# Patient Record
Sex: Female | Born: 1995 | Race: Black or African American | Hispanic: No | Marital: Single | Smoking: Never smoker
Health system: Southern US, Community
[De-identification: ages and names within clinical notes are randomized; demographics above are authoritative.]

---
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
## 2017-01-17 ENCOUNTER — Emergency Department (HOSPITAL_BASED_OUTPATIENT_CLINIC_OR_DEPARTMENT_OTHER)
Admission: EM | Admit: 2017-01-17 | Discharge: 2017-01-17 | Disposition: A | Discharge status: Home / Self Care | Payer: Medicaid Other | Attending: Emergency Medicine | Admitting: Emergency Medicine

## 2017-01-17 ENCOUNTER — Encounter (HOSPITAL_BASED_OUTPATIENT_CLINIC_OR_DEPARTMENT_OTHER): Payer: Self-pay

## 2017-01-17 DIAGNOSIS — J020 Streptococcal pharyngitis: Secondary | ICD-10-CM | POA: Insufficient documentation

## 2017-01-17 LAB — RAPID STREP SCREEN (MED CTR MEBANE ONLY): Streptococcus, Group A Screen (Direct): POSITIVE — AB

## 2017-01-17 MED ORDER — FLUCONAZOLE 200 MG PO TABS: 200.0000 mg | ORAL_TABLET | Freq: Every day | ORAL | 0 refills | Status: AC

## 2017-01-17 MED ORDER — DEXAMETHASONE 4 MG PO TABS
10.0000 mg | ORAL_TABLET | Freq: Once | ORAL | Status: AC
  Administered 2017-01-17: 10 mg via ORAL
  Filled 2017-01-17: qty 1

## 2017-01-17 MED ORDER — PENICILLIN G BENZATHINE 1200000 UNIT/2ML IM SUSP
1.2000 10*6.[IU] | Freq: Once | INTRAMUSCULAR | Status: AC
  Administered 2017-01-17: 1.2 10*6.[IU] via INTRAMUSCULAR
  Filled 2017-01-17: qty 2

## 2017-01-17 NOTE — ED Triage Notes (Signed)
C/o body aches, chills, HA x today-NAD-steady gait

## 2017-01-17 NOTE — ED Provider Notes (Signed)
[facility] Note   CSN: [id number] Arrival date & time: 01/17/17  1918 By signing my name below, I, [name], attest that this documentation has been prepared under the direction and in the presence of Gwyneth SproutPlunkett, Areana Kosanke, MD . Electronically Signed: [name], ED Scribe. 01/17/2017. 8:10 PM.  History   Chief Complaint Chief Complaint  Patient presents with  . Generalized Body Aches   HPI Courtney Ray is a 21 y.o. female who presents to the Emergency Department complaining of constant, moderate sore throat onset this morning. Pain is exacerbated by swallowing. No OTC treatments tried for these symptoms PTA.  She reports associated nasal congestion, generalized body aches, and subjective fever.  Pt denies any other medical problems other than recurrent strep throat. Pt denies cough, and trouble breathing, or abdominal pain.   The history is provided by the patient. No language interpreter was used.    History reviewed. No pertinent past medical history.  There are no active problems to display for this patient.   History reviewed. No pertinent surgical history.  OB History    No data available       Home Medications    Prior to Admission medications   Not on File    Family History No family history on file.  Social History Social History  Substance Use Topics  . Smoking status: Never Smoker  . Smokeless tobacco: Never Used  . Alcohol use Yes     Comment: occ     Allergies   Patient has no known allergies.   Review of Systems Review of Systems All systems reviewed and are negative for acute change except as noted in the HPI.  Physical Exam Updated Vital Signs BP (!) 125/58 (BP Location: Left Arm)   Pulse 100   Temp 99.9 F (37.7 C) (Oral)   Resp 20   Ht 5\' 6"  (1.676 m)   Wt 160 lb (72.6 kg)   LMP 01/03/2017   SpO2 100%   BMI 25.82 kg/m   Physical Exam  Constitutional: She appears well-developed and  well-nourished. No distress.  HENT:  Head: Normocephalic and atraumatic.  Mouth/Throat: Mucous membranes are normal. Oropharyngeal exudate and posterior oropharyngeal erythema present. No posterior oropharyngeal edema or tonsillar abscesses. Tonsils are 2+ on the right. Tonsils are 2+ on the left. Tonsillar exudate.  Bilateral TM within normal limits.  Eyes: Conjunctivae are normal.  Neck: Normal range of motion.  Cardiovascular: Normal rate, regular rhythm and normal heart sounds.  Exam reveals no gallop and no friction rub.   No murmur heard. Pulmonary/Chest: Effort normal and breath sounds normal. No respiratory distress. She has no wheezes. She has no rales. She exhibits no tenderness.  Abdominal: She exhibits no distension.  Musculoskeletal: Normal range of motion.  Lymphadenopathy:    She has cervical adenopathy (bilateral).  Neurological: She is alert.  Skin: No pallor.  Psychiatric: She has a normal mood and affect. Her behavior is normal.  Nursing note and vitals reviewed.  ED Treatments / Results  DIAGNOSTIC STUDIES:  Oxygen Saturation is 100% on RA, normal by my interpretation.    COORDINATION OF CARE:  8:25 PM Discussed treatment plan with pt at bedside and pt agreed to plan.  Labs (all labs ordered are listed, but only abnormal results are displayed) Labs Reviewed  RAPID STREP SCREEN ([facility] AT [facility]) - Abnormal; Notable for the following:       Result Value   Streptococcus, Group A Screen (Direct) POSITIVE (*)  All other components within normal limits    EKG  EKG Interpretation None       Radiology No results found.  Procedures Procedures (including critical care time)  Medications Ordered in ED Medications  dexamethasone (DECADRON) tablet 10 mg (10 mg Oral Given 01/17/17 2032)  penicillin g benzathine (BICILLIN LA) 1200000 UNIT/2ML injection 1.2 Million Units (1.2 Million Units Intramuscular Given 01/17/17 2032)     Initial Impression / Assessment  and Plan / ED Course  I have reviewed the triage vital signs and the nursing notes.  Pertinent labs & imaging results that were available during my care of the patient were reviewed by me and considered in my medical decision making (see chart for details).     Patient with symptoms classic for strep pharyngitis. Exam classic for strep. Strep testing done prior to exam is positive. Patient treated with Decadron and penicillin.  Final Clinical Impressions(s) / ED Diagnoses   Final diagnoses:  Strep pharyngitis    New Prescriptions There are no discharge medications for this patient.   I personally performed the services described in this documentation, which was scribed in my presence.  The recorded information has been reviewed and considered.     Gwyneth Sprout, MD 01/17/17 2100

## 2017-01-17 NOTE — ED Notes (Signed)
ED Provider at bedside. 

## 2017-01-31 ENCOUNTER — Encounter (HOSPITAL_BASED_OUTPATIENT_CLINIC_OR_DEPARTMENT_OTHER): Payer: Self-pay | Admitting: *Deleted

## 2017-01-31 ENCOUNTER — Emergency Department (HOSPITAL_BASED_OUTPATIENT_CLINIC_OR_DEPARTMENT_OTHER)
Admission: EM | Admit: 2017-01-31 | Discharge: 2017-01-31 | Disposition: A | Discharge status: Home / Self Care | Payer: Self-pay | Attending: Emergency Medicine | Admitting: Emergency Medicine

## 2017-01-31 DIAGNOSIS — J069 Acute upper respiratory infection, unspecified: Secondary | ICD-10-CM | POA: Insufficient documentation

## 2017-01-31 DIAGNOSIS — J029 Acute pharyngitis, unspecified: Secondary | ICD-10-CM | POA: Insufficient documentation

## 2017-01-31 LAB — RAPID STREP SCREEN (MED CTR MEBANE ONLY): Streptococcus, Group A Screen (Direct): NEGATIVE

## 2017-01-31 MED ORDER — NAPROXEN 500 MG PO TABS: 500.0000 mg | ORAL_TABLET | Freq: Two times a day (BID) | ORAL | 0 refills | Status: DC

## 2017-01-31 NOTE — Discharge Instructions (Signed)
Take the Naprosyn as directed for the throat pain. Can start over-the-counter cold and cough medicines as needed. Work note provided. Rapid strep testing here today was negative for strep throat.

## 2017-01-31 NOTE — ED Triage Notes (Signed)
Pt presents with sore throat since yesterday (reports being treated for strep a couple weeks ago). Denies fever. Reports nasal congestion.

## 2017-01-31 NOTE — ED Provider Notes (Signed)
[facility]   CSN: [id number] Arrival date & time: 01/31/17  1020     History   Chief Complaint Chief Complaint  Patient presents with  . Sore Throat    HPI Courtney Ray is a 21 y.o. female.  Patient with onset yesterday of sore throat and congestion. Patient about 2 weeks ago was diagnosed with strep throat and treated with IM penicillin. Better. Patient's had stroke frequently. She concerned she got it again. No fevers no nausea no vomiting no rash      History reviewed. No pertinent past medical history.  There are no active problems to display for this patient.   History reviewed. No pertinent surgical history.  OB History    No data available       Home Medications    Prior to Admission medications   Medication Sig Start Date End Date Taking? Authorizing Provider  naproxen (NAPROSYN) 500 MG tablet Take 1 tablet (500 mg total) by mouth 2 (two) times daily. 01/31/17   Vanetta Mulders, MD    Family History No family history on file.  Social History Social History  Substance Use Topics  . Smoking status: Never Smoker  . Smokeless tobacco: Never Used  . Alcohol use Yes     Comment: occ     Allergies   Patient has no known allergies.   Review of Systems Review of Systems  Constitutional: Negative for fever.  HENT: Positive for congestion and sore throat.   Respiratory: Negative for shortness of breath.   Cardiovascular: Negative for chest pain.  Gastrointestinal: Negative for abdominal pain, nausea and vomiting.  Genitourinary: Negative for dysuria.  Musculoskeletal: Negative for back pain.  Skin: Negative for rash.  Neurological: Negative for headaches.  Hematological: Does not bruise/bleed easily.  Psychiatric/Behavioral: Negative for confusion.     Physical Exam Updated Vital Signs BP 119/84 (BP Location: Right Arm)   Pulse 96   Temp 99 F (37.2 C) (Oral)   Resp 18   Ht 1.676 m (5\' 6" )   Wt 72.6 kg  (160 lb)   LMP 01/03/2017   SpO2 100%   BMI 25.82 kg/m   Physical Exam  Constitutional: She is oriented to person, place, and time. She appears well-developed and well-nourished. No distress.  HENT:  Head: Normocephalic and atraumatic.  Mouth/Throat: Oropharynx is clear and moist. No oropharyngeal exudate.  Erythema to the posterior pharynx left greater than right uvula midline no exudate  Eyes: Conjunctivae and EOM are normal. Pupils are equal, round, and reactive to light.  Neck: Normal range of motion. Neck supple.  Cardiovascular: Normal rate, regular rhythm and normal heart sounds.   Pulmonary/Chest: Effort normal and breath sounds normal. No respiratory distress.  Abdominal: Soft. Bowel sounds are normal. There is no tenderness.  Musculoskeletal: Normal range of motion.  Neurological: She is alert and oriented to person, place, and time. No cranial nerve deficit or sensory deficit. She exhibits normal muscle tone.  Skin: Skin is warm.  Nursing note and vitals reviewed.    ED Treatments / Results  Labs (all labs ordered are listed, but only abnormal results are displayed) Labs Reviewed  RAPID STREP SCREEN (NOT AT [facility])  CULTURE, GROUP A STREP [facility])    EKG  EKG Interpretation None       Radiology No results found.  Procedures Procedures (including critical care time)  Medications Ordered in ED Medications - No data to display   Initial Impression / Assessment and Plan / ED  Course  I have reviewed the triage vital signs and the nursing notes.  Pertinent labs & imaging results that were available during my care of the patient were reviewed by me and considered in my medical decision making (see chart for details).    Rapid strep is negative. Most likely viral pharyngitis with upper respiratory infection. Will treat symptomatically.   Final Clinical Impressions(s) / ED Diagnoses   Final diagnoses:  Pharyngitis, unspecified etiology  Viral upper  respiratory tract infection    New Prescriptions New Prescriptions   NAPROXEN (NAPROSYN) 500 MG TABLET    Take 1 tablet (500 mg total) by mouth 2 (two) times daily.     Vanetta MuldersZackowski, Wasil Wolke, MD 01/31/17 1246

## 2017-02-03 LAB — CULTURE, GROUP A STREP (THRC)

## 2018-04-18 ENCOUNTER — Emergency Department (HOSPITAL_BASED_OUTPATIENT_CLINIC_OR_DEPARTMENT_OTHER)
Admission: EM | Admit: 2018-04-18 | Discharge: 2018-04-18 | Disposition: A | Discharge status: Home / Self Care | Payer: Self-pay | Attending: Emergency Medicine | Admitting: Emergency Medicine

## 2018-04-18 ENCOUNTER — Encounter (HOSPITAL_BASED_OUTPATIENT_CLINIC_OR_DEPARTMENT_OTHER): Payer: Self-pay

## 2018-04-18 ENCOUNTER — Other Ambulatory Visit: Payer: Self-pay

## 2018-04-18 DIAGNOSIS — R300 Dysuria: Secondary | ICD-10-CM | POA: Insufficient documentation

## 2018-04-18 LAB — URINALYSIS, ROUTINE W REFLEX MICROSCOPIC
Bilirubin Urine: NEGATIVE
Glucose, UA: NEGATIVE mg/dL
KETONES UR: NEGATIVE mg/dL
NITRITE: NEGATIVE
PROTEIN: NEGATIVE mg/dL
Specific Gravity, Urine: 1.025 (ref 1.005–1.030)
pH: 6.5 (ref 5.0–8.0)

## 2018-04-18 LAB — URINALYSIS, MICROSCOPIC (REFLEX)

## 2018-04-18 LAB — PREGNANCY, URINE: PREG TEST UR: NEGATIVE

## 2018-04-18 MED ORDER — CEPHALEXIN 500 MG PO CAPS: 500.0000 mg | ORAL_CAPSULE | Freq: Two times a day (BID) | ORAL | 0 refills | Status: AC

## 2018-04-18 MED ORDER — PHENAZOPYRIDINE HCL 200 MG PO TABS: 200.0000 mg | ORAL_TABLET | Freq: Three times a day (TID) | ORAL | 0 refills | Status: DC

## 2018-04-18 NOTE — ED Triage Notes (Signed)
C/o dysuria x 2 days-NAD-steady gait 

## 2018-04-18 NOTE — Discharge Instructions (Signed)
You were seen in the emergency department for concern of urinary tract infection.  We discussed your urine results, they are equivocal meaning that you may or may not have a urinary tract infection.  Given that you have all of your classic UTI symptoms, we opted to treat you with antibiotics today.  Take Keflex as scheduled and until completion.  Take Pyridium for associated bladder spasm and pain.  Return to the ER for worsening symptoms, fevers, chills, inability to void urine, pain in your flank area, abnormal vaginal discharge or bleeding.

## 2018-04-18 NOTE — ED Provider Notes (Addendum)
[facility] EMERGENCY DEPARTMENT Provider Note   CSN: [id number] Arrival date & time: 04/18/18  2125     History   Chief Complaint Chief Complaint  Patient presents with  . Dysuria    HPI [name] is a 22 y.o. female with history of UTIs is here for evaluation of "UTI".  She reports associated suprapubic abdominal pain, diffuse low back pain, dysuria, frequent urination.  States the symptoms are very similar to her previous UTIs.  She has had 2 UTIs this year.  LMP 8/23.  She is sexually active with one female partner only with inconsistent condom use.  No other birth control methods.  Denies associated fevers, chills, nausea, vomiting, abnormal vaginal discharge or bleeding, diarrhea or constipation.  No interventions. No alleviating or aggravating symptoms. Severity is mild.  HPI  History reviewed. No pertinent past medical history.  There are no active problems to display for this patient.   History reviewed. No pertinent surgical history.   OB History   None      Home Medications    Prior to Admission medications   Medication Sig Start Date End Date Taking? Authorizing Provider  cephALEXin (KEFLEX) 500 MG capsule Take 1 capsule (500 mg total) by mouth 2 (two) times daily for 7 days. 04/18/18 04/25/18  Liberty HandyGibbons, Trinity Hyland J, PA-C  naproxen (NAPROSYN) 500 MG tablet Take 1 tablet (500 mg total) by mouth 2 (two) times daily. 01/31/17   Vanetta MuldersZackowski, Scott, MD  phenazopyridine (PYRIDIUM) 200 MG tablet Take 1 tablet (200 mg total) by mouth 3 (three) times daily. 04/18/18   Liberty HandyGibbons, Oree Mirelez J, PA-C    Family History No family history on file.  Social History Social History   Tobacco Use  . Smoking status: Never Smoker  . Smokeless tobacco: Never Used  Substance Use Topics  . Alcohol use: Yes    Comment: occ  . Drug use: No     Allergies   Patient has no known allergies.   Review of Systems Review of Systems  Gastrointestinal: Positive for abdominal  pain.  Genitourinary: Positive for dysuria and frequency.  Musculoskeletal: Positive for back pain.  All other systems reviewed and are negative.    Physical Exam Updated Vital Signs BP 133/86 (BP Location: Left Arm)   Pulse 87   Temp 98.6 F (37 C) (Oral)   Resp 16   Ht 5\' 6"  (1.676 m)   Wt 78.9 kg   LMP 04/13/2018   SpO2 100%   BMI 28.08 kg/m   Physical Exam  Constitutional: She is oriented to person, place, and time. She appears well-developed and well-nourished. No distress.  NAD.  HENT:  Head: Normocephalic and atraumatic.  Right Ear: External ear normal.  Left Ear: External ear normal.  Nose: Nose normal.  Eyes: Conjunctivae and EOM are normal.  Neck: Normal range of motion. Neck supple.  Cardiovascular: Normal rate, regular rhythm and normal heart sounds.  No murmur heard. Pulmonary/Chest: Effort normal and breath sounds normal.  Abdominal: Soft. There is no tenderness.  Mild suprapubic tenderness without guarding.  Negative Murphy's and McBurney's.  No CVA tenderness.  Active bowel sounds to lower quadrants.  No G/R/R.  Musculoskeletal: Normal range of motion.  Neurological: She is alert and oriented to person, place, and time.  Skin: Skin is warm and dry. Capillary refill takes less than 2 seconds.  Psychiatric: She has a normal mood and affect. Her behavior is normal. Judgment and thought content normal.  Nursing note and vitals reviewed.  ED Treatments / Results  Labs (all labs ordered are listed, but only abnormal results are displayed) Labs Reviewed  URINALYSIS, ROUTINE W REFLEX MICROSCOPIC - Abnormal; Notable for the following components:      Result Value   APPearance CLOUDY (*)    Hgb urine dipstick TRACE (*)    Leukocytes, UA SMALL (*)    All other components within normal limits  URINALYSIS, MICROSCOPIC (REFLEX) - Abnormal; Notable for the following components:   Bacteria, UA RARE (*)    All other components within normal limits  URINE  CULTURE  PREGNANCY, URINE    EKG None  Radiology No results found.  Procedures Procedures (including critical care time)  Medications Ordered in ED Medications - No data to display   Initial Impression / Assessment and Plan / ED Course  I have reviewed the triage vital signs and the nursing notes.  Pertinent labs & imaging results that were available during my care of the patient were reviewed by me and considered in my medical decision making (see chart for details).  Clinical Course as of Apr 20 7  Thu Apr 18, 2018  2258 Leukocytes, UA(!): SMALL [CG]  2258 Bacteria, UA(!): RARE [CG]  2259 WBC, UA: 21-50 [CG]    Clinical Course User Index [CG] Liberty Handy, PA-C   Likely UTI.  No CVAT to suggest pyelo or renal stone.  22 year old here with concern for UTI.  She has history of frequent UTIs and states her symptoms today are very classic of her UTIs.  She denies constitutional symptoms, abnormal vaginal discharge or bleeding.  Exam is reassuring without CVA tenderness.  She is not concerned about STDs and deferred pelvic exam, I think that this is reasonable as she is in a monogamous relationship without any vaginal symptoms or abnormal bleeding.  Pregnancy is negative.  Urinalysis with small leukocytes, rare bacteria, 21-50 WBCs.  Urine culture sent, given h/o frequent UTI for susceptibility. Given this with symptoms, will opt for keflex and pyridium.  Discussed risk of frequent abx for UTI including abx resistance.  Discussed return precautions. Pt in agreement.     Final Clinical Impressions(s) / ED Diagnoses   Final diagnoses:  Dysuria    ED Discharge Orders         Ordered    cephALEXin (KEFLEX) 500 MG capsule  2 times daily     04/18/18 2306    phenazopyridine (PYRIDIUM) 200 MG tablet  3 times daily     04/18/18 2306           Liberty Handy, PA-C 04/19/18 0014    Tilden Fossa, MD 04/19/18 [phone]

## 2018-04-21 LAB — URINE CULTURE

## 2018-04-22 ENCOUNTER — Telehealth: Payer: Self-pay | Admitting: Emergency Medicine

## 2018-04-22 NOTE — Telephone Encounter (Signed)
Post ED Visit - Positive Culture Follow-up  Culture report reviewed by antimicrobial stewardship pharmacist:  []  Enzo Bi, Pharm.D. []  Celedonio Miyamoto, Pharm.D., BCPS AQ-ID []  Garvin Fila, Pharm.D., BCPS []  Georgina Pillion, Pharm.D., BCPS []  [place], [place].D., BCPS, AAHIVP []  Estella Husk, Pharm.D., BCPS, AAHIVP []  Lysle Pearl, PharmD, BCPS []  Phillips Climes, PharmD, BCPS []  Agapito Games, PharmD, BCPS [x]  Verlan Friends, PharmD  Positive urine culture Treated with cephalexin, organism sensitive to the same and no further patient follow-up is required at this time.  Berle Mull 04/22/2018, 10:16 AM

## 2018-05-01 ENCOUNTER — Other Ambulatory Visit: Payer: Self-pay

## 2018-05-01 ENCOUNTER — Emergency Department (HOSPITAL_BASED_OUTPATIENT_CLINIC_OR_DEPARTMENT_OTHER)
Admission: EM | Admit: 2018-05-01 | Discharge: 2018-05-02 | Disposition: A | Discharge status: Home / Self Care | Payer: Self-pay | Attending: Emergency Medicine | Admitting: Emergency Medicine

## 2018-05-01 ENCOUNTER — Encounter (HOSPITAL_BASED_OUTPATIENT_CLINIC_OR_DEPARTMENT_OTHER): Payer: Self-pay | Admitting: *Deleted

## 2018-05-01 DIAGNOSIS — Z202 Contact with and (suspected) exposure to infections with a predominantly sexual mode of transmission: Secondary | ICD-10-CM

## 2018-05-01 DIAGNOSIS — Z113 Encounter for screening for infections with a predominantly sexual mode of transmission: Secondary | ICD-10-CM | POA: Insufficient documentation

## 2018-05-01 DIAGNOSIS — Z79899 Other long term (current) drug therapy: Secondary | ICD-10-CM | POA: Insufficient documentation

## 2018-05-01 MED ORDER — CEFTRIAXONE SODIUM 250 MG IJ SOLR
250.0000 mg | Freq: Once | INTRAMUSCULAR | Status: AC
  Administered 2018-05-02: 250 mg via INTRAMUSCULAR
  Filled 2018-05-01: qty 250

## 2018-05-01 MED ORDER — AZITHROMYCIN 1 G PO PACK
1.0000 g | PACK | Freq: Once | ORAL | Status: AC
  Administered 2018-05-02: 1 g via ORAL
  Filled 2018-05-01: qty 1

## 2018-05-01 NOTE — ED Triage Notes (Signed)
Pt c/o exposure to STD needs tx for chlamydia

## 2018-05-02 ENCOUNTER — Encounter (HOSPITAL_BASED_OUTPATIENT_CLINIC_OR_DEPARTMENT_OTHER): Payer: Self-pay | Admitting: Emergency Medicine

## 2018-05-02 LAB — URINALYSIS, ROUTINE W REFLEX MICROSCOPIC
BILIRUBIN URINE: NEGATIVE
GLUCOSE, UA: NEGATIVE mg/dL
HGB URINE DIPSTICK: NEGATIVE
KETONES UR: NEGATIVE mg/dL
Leukocytes, UA: NEGATIVE
Nitrite: NEGATIVE
PH: 6 (ref 5.0–8.0)
Protein, ur: NEGATIVE mg/dL
Specific Gravity, Urine: 1.015 (ref 1.005–1.030)

## 2018-05-02 LAB — WET PREP, GENITAL
Clue Cells Wet Prep HPF POC: NONE SEEN
SPERM: NONE SEEN
Trich, Wet Prep: NONE SEEN
Yeast Wet Prep HPF POC: NONE SEEN

## 2018-05-02 LAB — PREGNANCY, URINE: Preg Test, Ur: NEGATIVE

## 2018-05-02 NOTE — ED Provider Notes (Signed)
[facility] EMERGENCY DEPARTMENT Provider Note   CSN: [id number] Arrival date & time: 05/01/18  2339     History   Chief Complaint Chief Complaint  Patient presents with  . Exposure to STD    HPI Courtney Ray is a 22 y.o. female.  The history is provided by the patient.  Exposure to STD  This is a new problem. The current episode started more than 1 week ago. The problem occurs constantly. The problem has not changed since onset.Pertinent negatives include no chest pain, no abdominal pain, no headaches and no shortness of breath. Nothing aggravates the symptoms. Nothing relieves the symptoms. She has tried nothing for the symptoms. The treatment provided no relief.  Informed by ex boyfriend he has chlamydia.    History reviewed. No pertinent past medical history.  There are no active problems to display for this patient.   History reviewed. No pertinent surgical history.   OB History   None      Home Medications    Prior to Admission medications   Medication Sig Start Date End Date Taking? Authorizing Provider  naproxen (NAPROSYN) 500 MG tablet Take 1 tablet (500 mg total) by mouth 2 (two) times daily. 01/31/17   Vanetta Mulders, MD    Family History History reviewed. No pertinent family history.  Social History Social History   Tobacco Use  . Smoking status: Never Smoker  . Smokeless tobacco: Never Used  Substance Use Topics  . Alcohol use: Yes    Comment: occ  . Drug use: No     Allergies   Patient has no known allergies.   Review of Systems Review of Systems  Constitutional: Negative for fever.  Respiratory: Negative for shortness of breath.   Cardiovascular: Negative for chest pain.  Gastrointestinal: Negative for abdominal pain.  Genitourinary: Positive for vaginal discharge.  Musculoskeletal: Negative for back pain and gait problem.  Skin: Negative for rash.  Neurological: Negative for headaches.  All other systems reviewed  and are negative.    Physical Exam Updated Vital Signs BP (!) 146/78   Pulse 75   Temp 98.7 F (37.1 C)   Resp 16   Ht 5\' 6"  (1.676 m)   Wt 78.6 kg   LMP 04/13/2018   SpO2 100%   BMI 27.97 kg/m   Physical Exam  Constitutional: She is oriented to person, place, and time. She appears well-developed and well-nourished. No distress.  HENT:  Head: Normocephalic and atraumatic.  Mouth/Throat: No oropharyngeal exudate.  Eyes: Pupils are equal, round, and reactive to light. Conjunctivae are normal.  Neck: Normal range of motion. Neck supple.  Cardiovascular: Normal rate, regular rhythm, normal heart sounds and intact distal pulses.  Pulmonary/Chest: Effort normal. No stridor. She has no wheezes. She has no rales.  Abdominal: Soft. There is no tenderness.  Genitourinary: Vaginal discharge found.  Genitourinary Comments: White d/c no cmt chaperone present  Musculoskeletal: Normal range of motion.  Neurological: She is alert and oriented to person, place, and time. She displays normal reflexes.  Skin: Skin is warm and dry. Capillary refill takes less than 2 seconds.  Psychiatric: She has a normal mood and affect.     ED Treatments / Results  Labs (all labs ordered are listed, but only abnormal results are displayed) Labs Reviewed  WET PREP, GENITAL  URINALYSIS, ROUTINE W REFLEX MICROSCOPIC  PREGNANCY, URINE  GC/CHLAMYDIA PROBE AMP ([place]) NOT AT [facility]     Procedures Procedures (including critical care time)  Medications  Ordered in ED Medications  cefTRIAXone (ROCEPHIN) injection 250 mg (has no administration in time range)  azithromycin (ZITHROMAX) powder 1 g (has no administration in time range)      Final Clinical Impressions(s) / ED Diagnoses   Treated. No sexual activity of any kind until 7 days after all partners treated.     Return for fevers >100.4 unrelieved by medication, shortness of breath, intractable vomiting, or diarrhea, Inability to  tolerate liquids or food, cough, altered mental status or any concerns. No signs of systemic illness or infection. The patient is nontoxic-appearing on exam and vital signs are within normal limits.   I have reviewed the triage vital signs and the nursing notes. Pertinent labs &imaging results that were available during my care of the patient were reviewed by me and considered in my medical decision making (see chart for details).  After history, exam, and medical workup I feel the patient has been appropriately medically screened and is safe for discharge home. Pertinent diagnoses were discussed with the patient. Patient was given return precautions.     Anjulie Dipierro, MD 05/02/18 0005

## 2018-05-03 LAB — GC/CHLAMYDIA PROBE AMP (~~LOC~~) NOT AT ARMC
Chlamydia: POSITIVE — AB
Neisseria Gonorrhea: POSITIVE — AB

## 2018-12-26 ENCOUNTER — Encounter (HOSPITAL_BASED_OUTPATIENT_CLINIC_OR_DEPARTMENT_OTHER): Payer: Self-pay | Admitting: Emergency Medicine

## 2018-12-26 ENCOUNTER — Emergency Department (HOSPITAL_BASED_OUTPATIENT_CLINIC_OR_DEPARTMENT_OTHER)
Admission: EM | Admit: 2018-12-26 | Discharge: 2018-12-27 | Disposition: A | Discharge status: Home / Self Care | Payer: Self-pay | Attending: Emergency Medicine | Admitting: Emergency Medicine

## 2018-12-26 ENCOUNTER — Other Ambulatory Visit: Payer: Self-pay

## 2018-12-26 DIAGNOSIS — B9689 Other specified bacterial agents as the cause of diseases classified elsewhere: Secondary | ICD-10-CM

## 2018-12-26 DIAGNOSIS — N760 Acute vaginitis: Secondary | ICD-10-CM | POA: Insufficient documentation

## 2018-12-26 LAB — WET PREP, GENITAL
Sperm: NONE SEEN
Trich, Wet Prep: NONE SEEN
Yeast Wet Prep HPF POC: NONE SEEN

## 2018-12-26 LAB — PREGNANCY, URINE: Preg Test, Ur: NEGATIVE

## 2018-12-26 LAB — URINALYSIS, ROUTINE W REFLEX MICROSCOPIC
Bilirubin Urine: NEGATIVE
Glucose, UA: NEGATIVE mg/dL
Hgb urine dipstick: NEGATIVE
Ketones, ur: NEGATIVE mg/dL
Leukocytes,Ua: NEGATIVE
Nitrite: NEGATIVE
Protein, ur: NEGATIVE mg/dL
Specific Gravity, Urine: >1.03 — ABNORMAL HIGH (ref 1.005–1.030)
pH: 6 (ref 5.0–8.0)

## 2018-12-26 MED ORDER — METRONIDAZOLE 500 MG PO TABS: 500.0000 mg | ORAL_TABLET | Freq: Two times a day (BID) | ORAL | 0 refills | Status: DC

## 2018-12-26 NOTE — ED Provider Notes (Signed)
[facility] Provider Note: Lowella Dell, MD, FACEP  CSN: [id number] MRN: [id number] ARRIVAL: 12/26/18 at 2317 ROOM: MH09/MH09   CHIEF COMPLAINT  Vaginal Discharge   HISTORY OF PRESENT ILLNESS  12/26/18 11:41 PM Courtney Ray is a 23 y.o. female with a 3 to 4-day history of vulvovaginal irritation.  She describes the severity as "slight".  She describes it as burning.  She denies dysuria.  She has had a white vaginal discharge with an abnormal odor.  She denies vaginal bleeding.  She denies fever, chills, nausea, vomiting or diarrhea.   History reviewed. No pertinent past medical history.  History reviewed. No pertinent surgical history.  No family history on file.  Social History   Tobacco Use  . Smoking status: Never Smoker  . Smokeless tobacco: Never Used  Substance Use Topics  . Alcohol use: Yes    Comment: occ  . Drug use: No    Prior to Admission medications   Medication Sig Start Date End Date Taking? Authorizing Provider  naproxen (NAPROSYN) 500 MG tablet Take 1 tablet (500 mg total) by mouth 2 (two) times daily. 01/31/17   Vanetta Mulders, MD    Allergies Patient has no known allergies.   REVIEW OF SYSTEMS  Negative except as noted here or in the History of Present Illness.   PHYSICAL EXAMINATION  Initial Vital Signs Blood pressure (!) 135/91, pulse 65, temperature 99.1 F (37.3 C), temperature source Oral, resp. rate 16, height 5\' 6"  (1.676 m), weight 81.6 kg, last menstrual period 12/05/2018, SpO2 100 %.  Examination General: Well-developed, well-nourished female in no acute distress; appearance consistent with age of record HENT: normocephalic; atraumatic Eyes: Normal appearance Neck: supple Heart: regular rate and rhythm Lungs: clear to auscultation bilaterally Abdomen: soft; nondistended; nontender; bowel sounds present GU: Normal external genitalia; physiologic appearing vaginal discharge; no vaginal bleeding; no cervical  motion tenderness; no adnexal tenderness Extremities: No deformity; full range of motion; pulses normal Neurologic: Awake, alert and oriented; motor function intact in all extremities and symmetric; no facial droop Skin: Warm and dry Psychiatric: Normal mood and affect   RESULTS  Summary of this visit's results, reviewed by myself:   EKG Interpretation  Date/Time:    Ventricular Rate:    PR Interval:    QRS Duration:   QT Interval:    QTC Calculation:   R Axis:     Text Interpretation:        Laboratory Studies: Results for orders placed or performed during the hospital encounter of 12/26/18 (from the past 24 hour(s))  Urinalysis, Routine w reflex microscopic     Status: Abnormal   Collection Time: 12/26/18 11:27 PM  Result Value Ref Range   Color, Urine YELLOW YELLOW   APPearance HAZY (A) CLEAR   Specific Gravity, Urine >1.030 (H) 1.005 - 1.030   pH 6.0 5.0 - 8.0   Glucose, UA NEGATIVE NEGATIVE mg/dL   Hgb urine dipstick NEGATIVE NEGATIVE   Bilirubin Urine NEGATIVE NEGATIVE   Ketones, ur NEGATIVE NEGATIVE mg/dL   Protein, ur NEGATIVE NEGATIVE mg/dL   Nitrite NEGATIVE NEGATIVE   Leukocytes,Ua NEGATIVE NEGATIVE  Pregnancy, urine     Status: None   Collection Time: 12/26/18 11:27 PM  Result Value Ref Range   Preg Test, Ur NEGATIVE NEGATIVE  Wet prep, genital     Status: Abnormal   Collection Time: 12/26/18 11:27 PM  Result Value Ref Range   Yeast Wet Prep HPF POC NONE SEEN NONE SEEN   Trich,  Wet Prep NONE SEEN NONE SEEN   Clue Cells Wet Prep HPF POC PRESENT (A) NONE SEEN   WBC, Wet Prep HPF POC FEW (A) NONE SEEN   Sperm NONE SEEN    Imaging Studies: No results found.  ED COURSE and MDM  Nursing notes and initial vitals signs, including pulse oximetry, reviewed.  Vitals:   12/26/18 2324 12/26/18 2334  BP:  (!) 135/91  Pulse:  65  Resp:  16  Temp:  99.1 F (37.3 C)  TempSrc:  Oral  SpO2:  100%  Weight: 81.6 kg   Height: 5\' 6"  (1.676 m)    Diagnostic  studies consistent with bacterial vaginosis.  PROCEDURES    ED DIAGNOSES     ICD-10-CM   1. Bacterial vaginosis N76.0    B96.89        Willowdean Luhmann, MD 12/26/18 2356

## 2018-12-26 NOTE — ED Triage Notes (Signed)
Patient co vaginal discharge and irritation; states onset 2-3 days ago; states odor noticed as well; denies any urinary frequency or dysuria.

## 2018-12-27 NOTE — ED Notes (Signed)
Pt verbalizes understanding of d/c instructions and denies any further needs at this time. 

## 2018-12-30 LAB — GC/CHLAMYDIA PROBE AMP (~~LOC~~) NOT AT ARMC
Chlamydia: NEGATIVE
Neisseria Gonorrhea: NEGATIVE

## 2019-03-19 ENCOUNTER — Other Ambulatory Visit: Payer: Self-pay

## 2019-03-19 ENCOUNTER — Encounter (HOSPITAL_BASED_OUTPATIENT_CLINIC_OR_DEPARTMENT_OTHER): Payer: Self-pay | Admitting: Emergency Medicine

## 2019-03-19 DIAGNOSIS — N760 Acute vaginitis: Secondary | ICD-10-CM | POA: Insufficient documentation

## 2019-03-19 DIAGNOSIS — N390 Urinary tract infection, site not specified: Secondary | ICD-10-CM | POA: Insufficient documentation

## 2019-03-19 LAB — PREGNANCY, URINE: Preg Test, Ur: NEGATIVE

## 2019-03-19 NOTE — ED Triage Notes (Signed)
Pt states she is having irritation with urination that started on Monday  Pt states she is also having some vaginal irritation  Pt has hx of BV  Denies lower abd pain or back pain

## 2019-03-20 ENCOUNTER — Emergency Department (HOSPITAL_BASED_OUTPATIENT_CLINIC_OR_DEPARTMENT_OTHER)
Admission: EM | Admit: 2019-03-20 | Discharge: 2019-03-20 | Disposition: A | Discharge status: Home / Self Care | Payer: Medicaid Other | Attending: Emergency Medicine | Admitting: Emergency Medicine

## 2019-03-20 DIAGNOSIS — N390 Urinary tract infection, site not specified: Secondary | ICD-10-CM

## 2019-03-20 DIAGNOSIS — B9689 Other specified bacterial agents as the cause of diseases classified elsewhere: Secondary | ICD-10-CM

## 2019-03-20 LAB — URINALYSIS, ROUTINE W REFLEX MICROSCOPIC
Bilirubin Urine: NEGATIVE
Glucose, UA: NEGATIVE mg/dL
Hgb urine dipstick: NEGATIVE
Ketones, ur: NEGATIVE mg/dL
Nitrite: NEGATIVE
Protein, ur: NEGATIVE mg/dL
Specific Gravity, Urine: >1.03 — ABNORMAL HIGH (ref 1.005–1.030)
pH: 6 (ref 5.0–8.0)

## 2019-03-20 LAB — URINALYSIS, MICROSCOPIC (REFLEX)

## 2019-03-20 LAB — WET PREP, GENITAL
Sperm: NONE SEEN
Trich, Wet Prep: NONE SEEN
Yeast Wet Prep HPF POC: NONE SEEN

## 2019-03-20 MED ORDER — METRONIDAZOLE 500 MG PO TABS: 500.0000 mg | ORAL_TABLET | Freq: Two times a day (BID) | ORAL | 0 refills | Status: DC

## 2019-03-20 MED ORDER — FOSFOMYCIN TROMETHAMINE 3 G PO PACK
3.0000 g | PACK | Freq: Once | ORAL | Status: AC
  Administered 2019-03-20: 3 g via ORAL
  Filled 2019-03-20: qty 3

## 2019-03-20 NOTE — ED Provider Notes (Signed)
[facility] Provider Note: Lowella DellJ. Lane Virdia Ziesmer, MD, FACEP  CSN: [id number] MRN: [id number] ARRIVAL: 03/19/19 at 2330 ROOM: MH05/MH05   CHIEF COMPLAINT  Dysuria   HISTORY OF PRESENT ILLNESS  03/20/19 1:31 AM Courtney Ray is a 23 y.o. female with 3 days of discomfort with urination.  The discomfort is felt in her vulvovaginal region.  The symptoms are mild but remind her of past bacterial vaginosis.  She has a watery discharge earlier but none presently.  She has not used any over-the-counter products to treat her symptoms.  She denies fever, chills, nausea, vomiting, diarrhea or back pain.   History reviewed. No pertinent past medical history.  History reviewed. No pertinent surgical history.  Family History  Problem Relation Age of Onset  . Diabetes Brother   . Stroke Other   . Hypertension Other     Social History   Tobacco Use  . Smoking status: Never Smoker  . Smokeless tobacco: Never Used  Substance Use Topics  . Alcohol use: Yes    Comment: occ  . Drug use: No    Prior to Admission medications   Medication Sig Start Date End Date Taking? Authorizing Provider  metroNIDAZOLE (FLAGYL) 500 MG tablet Take 1 tablet (500 mg total) by mouth 2 (two) times daily. One po bid x 7 days 12/26/18   Chip Canepa, Jonny RuizJohn, MD    Allergies Patient has no known allergies.   REVIEW OF SYSTEMS  Negative except as noted here or in the History of Present Illness.   PHYSICAL EXAMINATION  Initial Vital Signs Blood pressure (!) 130/97, pulse 90, temperature 98.7 F (37.1 C), temperature source Oral, resp. rate 16, height 5\' 6"  (1.676 m), weight 83 kg, last menstrual period 03/10/2019, SpO2 100 %.  Examination General: Well-developed, well-nourished female in no acute distress; appearance consistent with age of record HENT: normocephalic; atraumatic Eyes: pupils equal, round and reactive to light; extraocular muscles intact Neck: supple Heart: regular rate and rhythm Lungs:  clear to auscultation bilaterally Abdomen: soft; nondistended; suprapubic tenderness; bowel sounds present GU: No CVA tenderness; normal external genitalia; physiologic appearing vaginal discharge; no vaginal bleeding; no cervical motion tenderness; no adnexal tenderness; bladder tenderness Extremities: No deformity; full range of motion; pulses normal Neurologic: Awake, alert and oriented; motor function intact in all extremities and symmetric; no facial droop Skin: Warm and dry Psychiatric: Normal mood and affect   RESULTS  Summary of this visit's results, reviewed by myself:   EKG Interpretation  Date/Time:    Ventricular Rate:    PR Interval:    QRS Duration:   QT Interval:    QTC Calculation:   R Axis:     Text Interpretation:        Laboratory Studies: Results for orders placed or performed during the hospital encounter of 03/20/19 (from the past 24 hour(s))  Urinalysis, Routine w reflex microscopic- may I&O cath if menses     Status: Abnormal   Collection Time: 03/19/19 11:48 PM  Result Value Ref Range   Color, Urine YELLOW YELLOW   APPearance CLOUDY (A) CLEAR   Specific Gravity, Urine >1.030 (H) 1.005 - 1.030   pH 6.0 5.0 - 8.0   Glucose, UA NEGATIVE NEGATIVE mg/dL   Hgb urine dipstick NEGATIVE NEGATIVE   Bilirubin Urine NEGATIVE NEGATIVE   Ketones, ur NEGATIVE NEGATIVE mg/dL   Protein, ur NEGATIVE NEGATIVE mg/dL   Nitrite NEGATIVE NEGATIVE   Leukocytes,Ua TRACE (A) NEGATIVE  Pregnancy, urine     Status: None  Collection Time: 03/19/19 11:48 PM  Result Value Ref Range   Preg Test, Ur NEGATIVE NEGATIVE  Urinalysis, Microscopic (reflex)     Status: Abnormal   Collection Time: 03/19/19 11:48 PM  Result Value Ref Range   RBC / HPF 0-5 0 - 5 RBC/hpf   WBC, UA 21-50 0 - 5 WBC/hpf   Bacteria, UA MANY (A) NONE SEEN   Squamous Epithelial / LPF 0-5 0 - 5  Wet prep, genital     Status: Abnormal   Collection Time: 03/20/19  1:47 AM   Specimen: Cervical/Vaginal swab   Result Value Ref Range   Yeast Wet Prep HPF POC NONE SEEN NONE SEEN   Trich, Wet Prep NONE SEEN NONE SEEN   Clue Cells Wet Prep HPF POC PRESENT (A) NONE SEEN   WBC, Wet Prep HPF POC MANY (A) NONE SEEN   Sperm NONE SEEN    Imaging Studies: No results found.  ED COURSE and MDM  Nursing notes and initial vitals signs, including pulse oximetry, reviewed.  Vitals:   03/19/19 2339 03/19/19 2340  BP: (!) 130/97   Pulse: 90   Resp: 16   Temp: 98.7 F (37.1 C)   TempSrc: Oral   SpO2: 100%   Weight:  83 kg  Height:  5\' 6"  (1.676 m)   History and laboratory studies consistent with urinary tract infection and bacterial vaginosis.  PROCEDURES    ED DIAGNOSES     ICD-10-CM   1. Lower urinary tract infectious disease  N39.0   2. Bacterial vaginosis  N76.0    B96.89        Mikolaj Woolstenhulme, Jenny Reichmann, MD 03/20/19 [phone]

## 2019-03-21 LAB — GC/CHLAMYDIA PROBE AMP (~~LOC~~) NOT AT ARMC
Chlamydia: NEGATIVE
Neisseria Gonorrhea: NEGATIVE

## 2019-03-22 LAB — URINE CULTURE: Culture: 20000 — AB

## 2019-03-23 ENCOUNTER — Telehealth: Payer: Self-pay | Admitting: Emergency Medicine

## 2019-03-23 NOTE — Telephone Encounter (Signed)
Post ED Visit - Positive Culture Follow-up  Culture report reviewed by antimicrobial stewardship pharmacist: [place] Team []  Elenor Quinones, Pharm.D. []  Heide Guile, Pharm.D., BCPS AQ-ID []  Parks Neptune, Pharm.D., BCPS []  Alycia Rossetti, Pharm.D., BCPS []  [place], [place].D., BCPS, AAHIVP []  Legrand Como, Pharm.D., BCPS, AAHIVP []  Salome Arnt, PharmD, BCPS []  Johnnette Gourd, PharmD, BCPS [x]  Hughes Better, PharmD, BCPS []  Leeroy Cha, PharmD []  Laqueta Linden, PharmD, BCPS []  Albertina Parr, PharmD  [place] Team []  Leodis Sias, PharmD []  Lindell Spar, PharmD []  Royetta Asal, PharmD []  Graylin Shiver, Rph []  Rema Fendt) Glennon Mac, PharmD []  Arlyn Dunning, PharmD []  Netta Cedars, PharmD []  Dia Sitter, PharmD []  Leone Haven, PharmD []  Gretta Arab, PharmD []  Theodis Shove, PharmD []  Peggyann Juba, PharmD []  Reuel Boom, PharmD   Positive urine culture Treated with Metronidazole, organism sensitive to the same and no further patient follow-up is required at this time.  Sandi Raveling Larae Caison 03/23/2019, 1:48 PM

## 2019-08-30 ENCOUNTER — Emergency Department (HOSPITAL_BASED_OUTPATIENT_CLINIC_OR_DEPARTMENT_OTHER)
Admission: EM | Admit: 2019-08-30 | Discharge: 2019-08-30 | Disposition: A | Discharge status: Home / Self Care | Payer: Medicaid Other | Attending: Emergency Medicine | Admitting: Emergency Medicine

## 2019-08-30 ENCOUNTER — Encounter (HOSPITAL_BASED_OUTPATIENT_CLINIC_OR_DEPARTMENT_OTHER): Payer: Self-pay | Admitting: *Deleted

## 2019-08-30 ENCOUNTER — Other Ambulatory Visit: Payer: Self-pay

## 2019-08-30 DIAGNOSIS — N3090 Cystitis, unspecified without hematuria: Secondary | ICD-10-CM

## 2019-08-30 LAB — URINALYSIS, ROUTINE W REFLEX MICROSCOPIC
Bilirubin Urine: NEGATIVE
Glucose, UA: NEGATIVE mg/dL
Hgb urine dipstick: NEGATIVE
Ketones, ur: NEGATIVE mg/dL
Nitrite: NEGATIVE
Protein, ur: NEGATIVE mg/dL
Specific Gravity, Urine: 1.02 (ref 1.005–1.030)
pH: 7 (ref 5.0–8.0)

## 2019-08-30 LAB — WET PREP, GENITAL
Clue Cells Wet Prep HPF POC: NONE SEEN
Sperm: NONE SEEN
Trich, Wet Prep: NONE SEEN
Yeast Wet Prep HPF POC: NONE SEEN

## 2019-08-30 LAB — URINALYSIS, MICROSCOPIC (REFLEX): Squamous Epithelial / HPF: >50 (ref 0–5)

## 2019-08-30 LAB — PREGNANCY, URINE: Preg Test, Ur: NEGATIVE

## 2019-08-30 MED ORDER — SULFAMETHOXAZOLE-TRIMETHOPRIM 800-160 MG PO TABS
1.0000 | ORAL_TABLET | Freq: Once | ORAL | Status: AC
  Administered 2019-08-30: 1 via ORAL
  Filled 2019-08-30: qty 1

## 2019-08-30 MED ORDER — SULFAMETHOXAZOLE-TRIMETHOPRIM 800-160 MG PO TABS: 1.0000 | ORAL_TABLET | Freq: Two times a day (BID) | ORAL | 0 refills | Status: AC

## 2019-08-30 NOTE — ED Triage Notes (Signed)
Pt reports dysuria x 1 week. She has been taking AZO. Also c/o lower abdominal pain and vaginal discharge

## 2019-08-30 NOTE — ED Provider Notes (Signed)
[place] EMERGENCY DEPARTMENT Provider Note   CSN: [id number] Arrival date & time: 08/30/19  1914     History Chief Complaint  Patient presents with  . Dysuria    Courtney Ray is a 24 y.o. female.  Pt presents to the ED today with dysuria for 1 week.  The pt has been taking AZOs which has not helping.  The pt denies sob or cp.  No f/c.  Pt has not been sexually active in 4 months.        History reviewed. No pertinent past medical history.  There are no problems to display for this patient.   History reviewed. No pertinent surgical history.   OB History   No obstetric history on file.     Family History  Problem Relation Age of Onset  . Diabetes Brother   . Stroke Other   . Hypertension Other     Social History   Tobacco Use  . Smoking status: Never Smoker  . Smokeless tobacco: Never Used  Substance Use Topics  . Alcohol use: Yes    Comment: occ  . Drug use: No    Home Medications Prior to Admission medications   Medication Sig Start Date End Date Taking? Authorizing Provider  metroNIDAZOLE (FLAGYL) 500 MG tablet Take 1 tablet (500 mg total) by mouth 2 (two) times daily. One po bid x 7 days 03/20/19   Molpus, John, MD  sulfamethoxazole-trimethoprim (BACTRIM DS) 800-160 MG tablet Take 1 tablet by mouth 2 (two) times daily for 7 days. 08/30/19 09/06/19  Isla Pence, MD    Allergies    Patient has no known allergies.  Review of Systems   Review of Systems  Genitourinary: Positive for dysuria.  All other systems reviewed and are negative.   Physical Exam Updated Vital Signs BP 125/73 (BP Location: Right Arm)   Pulse 74   Temp 98.4 F (36.9 C) (Oral)   Resp 18   Ht 5\' 6"  (1.676 m)   Wt 79.2 kg   LMP 08/09/2019 (Approximate)   SpO2 100%   BMI 28.20 kg/m   Physical Exam Vitals and nursing note reviewed. Exam conducted with a chaperone present.  Constitutional:      Appearance: Normal appearance.  HENT:     Head:  Normocephalic and atraumatic.     Right Ear: External ear normal.     Left Ear: External ear normal.     Nose: Nose normal.     Mouth/Throat:     Mouth: Mucous membranes are moist.     Pharynx: Oropharynx is clear.  Eyes:     Extraocular Movements: Extraocular movements intact.     Conjunctiva/sclera: Conjunctivae normal.     Pupils: Pupils are equal, round, and reactive to light.  Cardiovascular:     Rate and Rhythm: Normal rate and regular rhythm.     Pulses: Normal pulses.     Heart sounds: Normal heart sounds.  Pulmonary:     Effort: Pulmonary effort is normal.     Breath sounds: Normal breath sounds.  Abdominal:     General: Abdomen is flat. Bowel sounds are normal.     Palpations: Abdomen is soft.     Tenderness: There is abdominal tenderness in the suprapubic area.  Genitourinary:    Exam position: Lithotomy position.     Vagina: Normal.     Cervix: Discharge present.     Adnexa: Right adnexa normal and left adnexa normal.  Musculoskeletal:  General: Normal range of motion.     Cervical back: Normal range of motion and neck supple.  Skin:    General: Skin is warm.     Capillary Refill: Capillary refill takes less than 2 seconds.  Neurological:     General: No focal deficit present.     Mental Status: She is alert and oriented to person, place, and time.  Psychiatric:        Mood and Affect: Mood normal.        Behavior: Behavior normal.        Thought Content: Thought content normal.        Judgment: Judgment normal.     ED Results / Procedures / Treatments   Labs (all labs ordered are listed, but only abnormal results are displayed) Labs Reviewed  WET PREP, GENITAL - Abnormal; Notable for the following components:      Result Value   WBC, Wet Prep HPF POC FEW (*)    All other components within normal limits  URINALYSIS, ROUTINE W REFLEX MICROSCOPIC - Abnormal; Notable for the following components:   APPearance HAZY (*)    Leukocytes,Ua SMALL (*)     All other components within normal limits  URINALYSIS, MICROSCOPIC (REFLEX) - Abnormal; Notable for the following components:   Bacteria, UA MANY (*)    All other components within normal limits  PREGNANCY, URINE  GC/CHLAMYDIA PROBE AMP ([place]) NOT AT [facility]    EKG None  Radiology No results found.  Procedures Procedures (including critical care time)  Medications Ordered in ED Medications  sulfamethoxazole-trimethoprim (BACTRIM DS) 800-160 MG per tablet 1 tablet (has no administration in time range)    ED Course  I have reviewed the triage vital signs and the nursing notes.  Pertinent labs & imaging results that were available during my care of the patient were reviewed by me and considered in my medical decision making (see chart for details).    MDM Rules/Calculators/A&P                      Pt does have many bacteria in her urine.  She is symptomatic, so will be treated with bactrim.  Return if worse.  F/u with pcp.  Final Clinical Impression(s) / ED Diagnoses Final diagnoses:  Cystitis    Rx / DC Orders ED Discharge Orders         Ordered    sulfamethoxazole-trimethoprim (BACTRIM DS) 800-160 MG tablet  2 times daily     08/30/19 2052           Jacalyn Lefevre, MD 08/30/19 2052

## 2019-09-02 LAB — GC/CHLAMYDIA PROBE AMP (~~LOC~~) NOT AT ARMC
Chlamydia: NEGATIVE
Neisseria Gonorrhea: NEGATIVE

## 2019-12-09 ENCOUNTER — Encounter (HOSPITAL_BASED_OUTPATIENT_CLINIC_OR_DEPARTMENT_OTHER): Payer: Self-pay

## 2019-12-09 ENCOUNTER — Other Ambulatory Visit: Payer: Self-pay

## 2019-12-09 ENCOUNTER — Emergency Department (HOSPITAL_BASED_OUTPATIENT_CLINIC_OR_DEPARTMENT_OTHER)
Admission: EM | Admit: 2019-12-09 | Discharge: 2019-12-09 | Disposition: A | Discharge status: Home / Self Care | Payer: Managed Care, Other (non HMO) | Attending: Emergency Medicine | Admitting: Emergency Medicine

## 2019-12-09 DIAGNOSIS — N899 Noninflammatory disorder of vagina, unspecified: Secondary | ICD-10-CM | POA: Diagnosis not present

## 2019-12-09 DIAGNOSIS — N390 Urinary tract infection, site not specified: Secondary | ICD-10-CM | POA: Diagnosis not present

## 2019-12-09 DIAGNOSIS — N898 Other specified noninflammatory disorders of vagina: Secondary | ICD-10-CM

## 2019-12-09 DIAGNOSIS — R300 Dysuria: Secondary | ICD-10-CM | POA: Diagnosis present

## 2019-12-09 LAB — URINALYSIS, ROUTINE W REFLEX MICROSCOPIC
Bilirubin Urine: NEGATIVE
Glucose, UA: NEGATIVE mg/dL
Ketones, ur: NEGATIVE mg/dL
Nitrite: NEGATIVE
Protein, ur: NEGATIVE mg/dL
Specific Gravity, Urine: >1.03 — ABNORMAL HIGH (ref 1.005–1.030)
pH: 6 (ref 5.0–8.0)

## 2019-12-09 LAB — HIV ANTIBODY (ROUTINE TESTING W REFLEX): HIV Screen 4th Generation wRfx: NONREACTIVE

## 2019-12-09 LAB — WET PREP, GENITAL
Clue Cells Wet Prep HPF POC: NONE SEEN
Sperm: NONE SEEN
Trich, Wet Prep: NONE SEEN
Yeast Wet Prep HPF POC: NONE SEEN

## 2019-12-09 LAB — URINALYSIS, MICROSCOPIC (REFLEX): RBC / HPF: NONE SEEN RBC/hpf (ref 0–5)

## 2019-12-09 LAB — PREGNANCY, URINE: Preg Test, Ur: NEGATIVE

## 2019-12-09 MED ORDER — PHENAZOPYRIDINE HCL 200 MG PO TABS: 200.0000 mg | ORAL_TABLET | Freq: Three times a day (TID) | ORAL | 0 refills | Status: DC

## 2019-12-09 MED ORDER — CEPHALEXIN 500 MG PO CAPS: 500.0000 mg | ORAL_CAPSULE | Freq: Three times a day (TID) | ORAL | 0 refills | Status: DC

## 2019-12-09 MED FILL — PHENAZOPYRIDINE 200 MG TAB: 200 | 2 days supply | Qty: 6 | Fill #0

## 2019-12-09 MED FILL — CEPHALEXIN 500 MG CAPSULE: 500 | 7 days supply | Qty: 21 | Fill #0

## 2019-12-09 NOTE — ED Triage Notes (Signed)
Pt states that she thinks she has a UTI, c/o some discharge, pain and discomfort with urination. Pt reports she drinks a lot of energy drinks.

## 2019-12-09 NOTE — Discharge Instructions (Addendum)
You seen in the emergency department for evaluation of burning with urination and vaginal discharge.  You had STD testing that was pending at time of discharge.  We will call you if anything ends up being positive.  We are treating you with a urinary tract infection with antibiotics and some medicine to help with the stinging.  Please keep well-hydrated.  Follow-up with your doctor or return to the emergency department if any worsening or concerning symptoms

## 2019-12-09 NOTE — ED Provider Notes (Signed)
[facility] EMERGENCY DEPARTMENT Provider Note   CSN: [id number] Arrival date & time: 12/09/19  1152     History Chief Complaint  Patient presents with  . Dysuria    Courtney Ray is a 24 y.o. female.  She is here with a concern for urinary tract infection or an STD.  She said she has had some urinary frequency and some dysuria for the last few days.  She is also noticed some itching in her vaginal area.  She is sexually active and usually uses barrier protection but sometimes does not.  No prior history of STDs.  No fevers chills nausea vomiting abdominal pain.  The history is provided by the patient.  Dysuria Pain quality:  Burning Pain severity:  Mild Onset quality:  Gradual Timing:  Intermittent Progression:  Unchanged Chronicity:  New Recent urinary tract infections: no   Relieved by:  Nothing Worsened by:  Nothing Ineffective treatments:  None tried Urinary symptoms: frequent urination   Urinary symptoms: no hematuria and no bladder incontinence   Associated symptoms: vaginal discharge   Associated symptoms: no abdominal pain, no fever, no nausea and no vomiting   Vaginal discharge:    Quality:  White   Severity:  Mild   Onset quality:  Gradual   Timing:  Intermittent   Progression:  Unchanged   Chronicity:  New Risk factors: sexually active   Risk factors: no sexually transmitted infections        History reviewed. No pertinent past medical history.  There are no problems to display for this patient.   History reviewed. No pertinent surgical history.   OB History   No obstetric history on file.     Family History  Problem Relation Age of Onset  . Diabetes Brother   . Stroke Other   . Hypertension Other     Social History   Tobacco Use  . Smoking status: Never Smoker  . Smokeless tobacco: Never Used  Substance Use Topics  . Alcohol use: Yes    Comment: occ  . Drug use: No    Home Medications Prior to Admission medications     Medication Sig Start Date End Date Taking? Authorizing Provider  metroNIDAZOLE (FLAGYL) 500 MG tablet Take 1 tablet (500 mg total) by mouth 2 (two) times daily. One po bid x 7 days 03/20/19   Molpus, John, MD    Allergies    Patient has no known allergies.  Review of Systems   Review of Systems  Constitutional: Negative for fever.  HENT: Negative for sore throat.   Eyes: Negative for visual disturbance.  Respiratory: Negative for cough.   Cardiovascular: Negative for chest pain.  Gastrointestinal: Negative for abdominal pain, nausea and vomiting.  Genitourinary: Positive for dysuria and vaginal discharge.  Musculoskeletal: Negative for back pain.  Skin: Negative for rash.  Neurological: Negative for headaches.    Physical Exam Updated Vital Signs BP 121/73 (BP Location: Right Arm)   Pulse 85   Temp 98.2 F (36.8 C) (Oral)   Resp 14   Ht 5\' 6"  (1.676 m)   Wt 77.1 kg   LMP 11/09/2019   SpO2 99%   BMI 27.44 kg/m   Physical Exam Vitals and nursing note reviewed.  Constitutional:      General: She is not in acute distress.    Appearance: She is well-developed.  HENT:     Head: Normocephalic and atraumatic.  Eyes:     Conjunctiva/sclera: Conjunctivae normal.  Cardiovascular:  Rate and Rhythm: Normal rate and regular rhythm.     Heart sounds: No murmur.  Pulmonary:     Effort: Pulmonary effort is normal. No respiratory distress.     Breath sounds: Normal breath sounds.  Abdominal:     Palpations: Abdomen is soft.     Tenderness: There is no abdominal tenderness.  Genitourinary:    Comments: Pelvic exam done with nurse Sophie as chaperone.  She has no external lesions.  Speculum exam showing thick white and yellow discharge mild to moderate amount.  Sample sent for GC chlamydia and wet prep.  No cervical motion tenderness no adnexal masses or tenderness. Musculoskeletal:        General: No deformity or signs of injury. Normal range of motion.     Cervical back:  Neck supple.  Skin:    General: Skin is warm and dry.     Capillary Refill: Capillary refill takes less than 2 seconds.  Neurological:     General: No focal deficit present.     Mental Status: She is alert.     ED Results / Procedures / Treatments   Labs (all labs ordered are listed, but only abnormal results are displayed) Labs Reviewed  WET PREP, GENITAL - Abnormal; Notable for the following components:      Result Value   WBC, Wet Prep HPF POC MANY (*)    All other components within normal limits  URINALYSIS, ROUTINE W REFLEX MICROSCOPIC - Abnormal; Notable for the following components:   APPearance HAZY (*)    Specific Gravity, Urine >1.030 (*)    Hgb urine dipstick SMALL (*)    Leukocytes,Ua MODERATE (*)    All other components within normal limits  URINALYSIS, MICROSCOPIC (REFLEX) - Abnormal; Notable for the following components:   Bacteria, UA MANY (*)    All other components within normal limits  PREGNANCY, URINE  RPR  HIV ANTIBODY (ROUTINE TESTING W REFLEX)  GC/CHLAMYDIA PROBE AMP ([place]) NOT AT [facility]    EKG None  Radiology No results found.  Procedures Procedures (including critical care time)  Medications Ordered in ED Medications - No data to display  ED Course  I have reviewed the triage vital signs and the nursing notes.  Pertinent labs & imaging results that were available during my care of the patient were reviewed by me and considered in my medical decision making (see chart for details).    MDM Rules/Calculators/A&P                     24 year old female here with couple of days of dysuria and vaginal discharge.  Differential includes UTI, STD, BV, retained foreign body.  Patient requests STD testing.  Her urinalysis shows 21-50 whites and many bacteria nitrite negative equivocal sample with 10-20 squames.  Wet prep showing no yeast no tract no clue cells.  GC chlamydia, RPR, HIV testing pending at time of discharge.  Will treat for possible  UTI with antibiotics and Pyridium.  Patient understands we will call her if any other results are positive.  Counseled on barrier protection.  Return instructions discussed.  Final Clinical Impression(s) / ED Diagnoses Final diagnoses:  Lower urinary tract infectious disease  Vaginal discharge    Rx / DC Orders ED Discharge Orders         Ordered    cephALEXin (KEFLEX) 500 MG capsule  3 times daily     12/09/19 1354    phenazopyridine (PYRIDIUM) 200 MG tablet  3 times daily     12/09/19 1354           Hayden Rasmussen, MD 12/09/19 [phone]

## 2019-12-10 LAB — GC/CHLAMYDIA PROBE AMP (~~LOC~~) NOT AT ARMC
Chlamydia: NEGATIVE
Comment: NEGATIVE
Comment: NORMAL
Neisseria Gonorrhea: NEGATIVE

## 2019-12-10 LAB — RPR: RPR Ser Ql: NONREACTIVE

## 2021-02-09 ENCOUNTER — Telehealth: Payer: Self-pay | Admitting: Nurse Practitioner

## 2021-02-09 ENCOUNTER — Other Ambulatory Visit: Payer: Self-pay

## 2021-02-09 ENCOUNTER — Encounter (HOSPITAL_BASED_OUTPATIENT_CLINIC_OR_DEPARTMENT_OTHER): Payer: Self-pay | Admitting: Emergency Medicine

## 2021-02-09 ENCOUNTER — Emergency Department (HOSPITAL_BASED_OUTPATIENT_CLINIC_OR_DEPARTMENT_OTHER)
Admission: EM | Admit: 2021-02-09 | Discharge: 2021-02-09 | Disposition: A | Discharge status: Home / Self Care | Payer: Managed Care, Other (non HMO) | Attending: Emergency Medicine | Admitting: Emergency Medicine

## 2021-02-09 DIAGNOSIS — Z28310 Unvaccinated for covid-19: Secondary | ICD-10-CM | POA: Insufficient documentation

## 2021-02-09 DIAGNOSIS — R059 Cough, unspecified: Secondary | ICD-10-CM | POA: Diagnosis present

## 2021-02-09 DIAGNOSIS — U071 COVID-19: Secondary | ICD-10-CM

## 2021-02-09 LAB — RESP PANEL BY RT-PCR (FLU A&B, COVID) ARPGX2
Influenza A by PCR: NEGATIVE
Influenza B by PCR: NEGATIVE
SARS Coronavirus 2 by RT PCR: POSITIVE — AB

## 2021-02-09 LAB — GROUP A STREP BY PCR: Group A Strep by PCR: NOT DETECTED

## 2021-02-09 NOTE — Discharge Instructions (Addendum)
You are seen in the emergency department for headache fevers chills body aches.  You tested positive for COVID.  You should isolate for 5 days from the beginning of your symptoms.  Fluids.  Tylenol and ibuprofen.  Return to the emergency department for any worsening or concerning symptoms.

## 2021-02-09 NOTE — ED Provider Notes (Signed)
[facility] EMERGENCY DEPARTMENT Provider Note   CSN: [id number] Arrival date & time: 02/09/21  2376     History Chief Complaint  Patient presents with   Generalized Body Aches    Courtney Ray is a 25 y.o. female.  He is here with a complaint of 2 days of scratchy sore throat body aches nonproductive cough mild headache.  No nausea vomiting diarrhea or urinary symptoms.  Feels hot and cold but has not checked for fever.  No sick contacts.  She is not COVID vaccinated.  The history is provided by the patient.  Influenza Presenting symptoms: cough, fatigue, headache, myalgias and sore throat   Presenting symptoms: no diarrhea, no nausea, no shortness of breath and no vomiting   Severity:  Moderate Onset quality:  Gradual Duration:  2 days Progression:  Unchanged Chronicity:  New Relieved by:  None tried Worsened by:  Movement Ineffective treatments:  None tried Associated symptoms: chills   Associated symptoms: no mental status change, no neck stiffness and no syncope   Risk factors: no immunocompromised state and no sick contacts       History reviewed. No pertinent past medical history.  There are no problems to display for this patient.   History reviewed. No pertinent surgical history.   OB History   No obstetric history on file.     Family History  Problem Relation Age of Onset   Diabetes Brother    Stroke Other    Hypertension Other     Social History   Tobacco Use   Smoking status: Never   Smokeless tobacco: Never  Vaping Use   Vaping Use: Never used  Substance Use Topics   Alcohol use: Yes    Comment: occ   Drug use: No    Home Medications Prior to Admission medications   Medication Sig Start Date End Date Taking? Authorizing Provider  cephALEXin (KEFLEX) 500 MG capsule Take 1 capsule (500 mg total) by mouth 3 (three) times daily. 12/09/19   Terrilee Files, MD  metroNIDAZOLE (FLAGYL) 500 MG tablet Take 1 tablet (500 mg total)  by mouth 2 (two) times daily. One po bid x 7 days 03/20/19   Molpus, John, MD  phenazopyridine (PYRIDIUM) 200 MG tablet Take 1 tablet (200 mg total) by mouth 3 (three) times daily. 12/09/19   Terrilee Files, MD    Allergies    Patient has no allergy information on record.  Review of Systems   Review of Systems  Constitutional:  Positive for chills and fatigue.  HENT:  Positive for sore throat.   Eyes:  Negative for visual disturbance.  Respiratory:  Positive for cough. Negative for shortness of breath.   Cardiovascular:  Negative for chest pain.  Gastrointestinal:  Negative for diarrhea, nausea and vomiting.  Genitourinary:  Negative for dysuria.  Musculoskeletal:  Positive for myalgias. Negative for neck stiffness.  Skin:  Negative for rash.  Neurological:  Positive for headaches.   Physical Exam Updated Vital Signs BP 132/74 (BP Location: Right Arm)   Pulse 99   Temp 99.2 F (37.3 C)   Resp 16   Ht 5\' 6"  (1.676 m)   Wt 77.1 kg   SpO2 99%   BMI 27.44 kg/m   Physical Exam Vitals and nursing note reviewed.  Constitutional:      General: She is not in acute distress.    Appearance: Normal appearance. She is well-developed.  HENT:     Head: Normocephalic and atraumatic.  Mouth/Throat:     Mouth: Mucous membranes are moist.     Pharynx: Oropharynx is clear. No oropharyngeal exudate or posterior oropharyngeal erythema.  Eyes:     Conjunctiva/sclera: Conjunctivae normal.  Cardiovascular:     Rate and Rhythm: Normal rate and regular rhythm.     Heart sounds: No murmur heard. Pulmonary:     Effort: Pulmonary effort is normal. No respiratory distress.     Breath sounds: Normal breath sounds. No stridor. No wheezing.  Abdominal:     Palpations: Abdomen is soft.     Tenderness: There is no abdominal tenderness.  Musculoskeletal:        General: No tenderness. Normal range of motion.     Cervical back: Neck supple.  Skin:    General: Skin is warm and dry.   Neurological:     General: No focal deficit present.     Mental Status: She is alert.     GCS: GCS eye subscore is 4. GCS verbal subscore is 5. GCS motor subscore is 6.    ED Results / Procedures / Treatments   Labs (all labs ordered are listed, but only abnormal results are displayed) Labs Reviewed  RESP PANEL BY RT-PCR (FLU A&B, COVID) ARPGX2 - Abnormal; Notable for the following components:      Result Value   SARS Coronavirus 2 by RT PCR POSITIVE (*)    All other components within normal limits  GROUP A STREP BY PCR    EKG None  Radiology No results found.  Procedures Procedures   Medications Ordered in ED Medications - No data to display  ED Course  I have reviewed the triage vital signs and the nursing notes.  Pertinent labs & imaging results that were available during my care of the patient were reviewed by me and considered in my medical decision making (see chart for details).    MDM Rules/Calculators/A&P                         Courtney Ray was evaluated in Emergency Department on 02/09/2021 for the symptoms described in the history of present illness. She was evaluated in the context of the global COVID-19 pandemic, which necessitated consideration that the patient might be at risk for infection with the SARS-CoV-2 virus that causes COVID-19. Institutional protocols and algorithms that pertain to the evaluation of patients at risk for COVID-19 are in a state of rapid change based on information released by regulatory bodies including the CDC and federal and state organizations. These policies and algorithms were followed during the patient's care in the ED. Differential diagnosis includes COVID, flu, strep throat, pneumonia  Final Clinical Impression(s) / ED Diagnoses Final diagnoses:  COVID-19 virus infection    Rx / DC Orders ED Discharge Orders     None        Terrilee Files, MD 02/09/21 [place]

## 2021-02-09 NOTE — Telephone Encounter (Signed)
Pt was called VM was left follow up from ED VIsit Pt Covid Positive

## 2021-02-09 NOTE — ED Triage Notes (Signed)
Body aches  and scratchy throat x 2 days  , no vaccine  has been taking   OTC meds only lasting 4 hors

## 2021-09-28 ENCOUNTER — Encounter (HOSPITAL_BASED_OUTPATIENT_CLINIC_OR_DEPARTMENT_OTHER): Payer: Self-pay

## 2021-09-28 ENCOUNTER — Other Ambulatory Visit: Payer: Self-pay

## 2021-09-28 ENCOUNTER — Emergency Department (HOSPITAL_BASED_OUTPATIENT_CLINIC_OR_DEPARTMENT_OTHER)
Admission: EM | Admit: 2021-09-28 | Discharge: 2021-09-28 | Disposition: A | Discharge status: Home / Self Care | Payer: Managed Care, Other (non HMO) | Attending: Emergency Medicine | Admitting: Emergency Medicine

## 2021-09-28 DIAGNOSIS — N760 Acute vaginitis: Secondary | ICD-10-CM

## 2021-09-28 DIAGNOSIS — B9689 Other specified bacterial agents as the cause of diseases classified elsewhere: Secondary | ICD-10-CM

## 2021-09-28 DIAGNOSIS — N898 Other specified noninflammatory disorders of vagina: Secondary | ICD-10-CM | POA: Insufficient documentation

## 2021-09-28 LAB — WET PREP, GENITAL
Sperm: NONE SEEN
Trich, Wet Prep: NONE SEEN
WBC, Wet Prep HPF POC: >=10 — AB (ref <10)
Yeast Wet Prep HPF POC: NONE SEEN

## 2021-09-28 LAB — URINALYSIS, ROUTINE W REFLEX MICROSCOPIC
Bilirubin Urine: NEGATIVE
Glucose, UA: NEGATIVE mg/dL
Hgb urine dipstick: NEGATIVE
Ketones, ur: NEGATIVE mg/dL
Leukocytes,Ua: NEGATIVE
Nitrite: NEGATIVE
Protein, ur: NEGATIVE mg/dL
Specific Gravity, Urine: >=1.03 (ref 1.005–1.030)
pH: 5.5 (ref 5.0–8.0)

## 2021-09-28 LAB — PREGNANCY, URINE: Preg Test, Ur: NEGATIVE

## 2021-09-28 MED ORDER — METRONIDAZOLE 500 MG PO TABS: 500.0000 mg | ORAL_TABLET | Freq: Two times a day (BID) | ORAL | 0 refills | Status: DC

## 2021-09-28 NOTE — ED Provider Notes (Signed)
[facility] EMERGENCY DEPARTMENT Provider Note   CSN: [id number] Arrival date & time: 09/28/21  1951     History  Chief Complaint  Patient presents with   Vaginal Discharge    Courtney Ray is a 26 y.o. female.  Patient is a 26 year old female with no significant past medical history.  She presents today with complaints of vaginal discharge.  She noted this yesterday.  She denies abdominal pain, but does describe some burning with urination.  She denies fevers or chills.  She denies new sexual contacts.  The history is provided by the patient.  Vaginal Discharge Quality:  White Severity:  Moderate Onset quality:  Sudden Duration:  2 days Timing:  Constant Progression:  Worsening Chronicity:  New Relieved by:  Nothing Worsened by:  Nothing     Home Medications Prior to Admission medications   Medication Sig Start Date End Date Taking? Authorizing Provider  cephALEXin (KEFLEX) 500 MG capsule Take 1 capsule (500 mg total) by mouth 3 (three) times daily. 12/09/19   Terrilee Files, MD  metroNIDAZOLE (FLAGYL) 500 MG tablet Take 1 tablet (500 mg total) by mouth 2 (two) times daily. One po bid x 7 days 03/20/19   Molpus, John, MD  phenazopyridine (PYRIDIUM) 200 MG tablet Take 1 tablet (200 mg total) by mouth 3 (three) times daily. 12/09/19   Terrilee Files, MD      Allergies    Patient has no known allergies.    Review of Systems   Review of Systems  Genitourinary:  Positive for vaginal discharge.  All other systems reviewed and are negative.  Physical Exam Updated Vital Signs BP 125/83 (BP Location: Right Arm)    Pulse 83    Temp 98.5 F (36.9 C) (Oral)    Resp 14    Ht 5\' 6"  (1.676 m)    Wt 79.8 kg    LMP 09/05/2021    SpO2 99%    BMI 28.41 kg/m  Physical Exam Vitals and nursing note reviewed.  Constitutional:      General: She is not in acute distress.    Appearance: Normal appearance. She is not ill-appearing.  HENT:     Head: Normocephalic and  atraumatic.  Pulmonary:     Effort: Pulmonary effort is normal.  Abdominal:     General: There is no distension.     Tenderness: There is no abdominal tenderness.  Genitourinary:    General: Normal vulva.     Vagina: Vaginal discharge present.     Comments: There is a moderate, white discharge noted.  There are no other obvious abnormalities with the pelvic exam. Skin:    General: Skin is warm and dry.  Neurological:     Mental Status: She is alert.    ED Results / Procedures / Treatments   Labs (all labs ordered are listed, but only abnormal results are displayed) Labs Reviewed  URINALYSIS, ROUTINE W REFLEX MICROSCOPIC - Abnormal; Notable for the following components:      Result Value   APPearance HAZY (*)    All other components within normal limits  WET PREP, GENITAL  PREGNANCY, URINE  GC/CHLAMYDIA PROBE AMP ([place]) NOT AT [facility]    EKG None  Radiology No results found.  Procedures Procedures    Medications Ordered in ED Medications - No data to display  ED Course/ Medical Decision Making/ A&P  Patient presenting with vaginal discharge.  Her pelvic exam is basically unremarkable with the exception of a whitish  discharge.  She does have clue cells noted on wet prep, but no yeast.  Patient to be treated with Flagyl.  GC and Chlamydia cultures pending, but with no new contacts or exposures, doubt this possibility.  We will forego treatment pending culture results.  Final Clinical Impression(s) / ED Diagnoses Final diagnoses:  None    Rx / DC Orders ED Discharge Orders     None         Geoffery Lyons, MD 09/28/21 2325

## 2021-09-28 NOTE — ED Triage Notes (Signed)
Pt c/o dysuria, vaginal d/c-states she has been using monostat-NAD-steady gait

## 2021-09-28 NOTE — Discharge Instructions (Addendum)
Begin taking Flagyl as prescribed.  We will call you if your cultures indicate you need further treatment or need to take additional action.

## 2021-09-30 LAB — GC/CHLAMYDIA PROBE AMP (~~LOC~~) NOT AT ARMC
Chlamydia: NEGATIVE
Comment: NEGATIVE
Comment: NORMAL
Neisseria Gonorrhea: NEGATIVE

## 2022-02-13 ENCOUNTER — Encounter (HOSPITAL_BASED_OUTPATIENT_CLINIC_OR_DEPARTMENT_OTHER): Payer: Self-pay | Admitting: Emergency Medicine

## 2022-02-13 ENCOUNTER — Other Ambulatory Visit: Payer: Self-pay

## 2022-02-13 ENCOUNTER — Emergency Department (HOSPITAL_BASED_OUTPATIENT_CLINIC_OR_DEPARTMENT_OTHER)
Admission: EM | Admit: 2022-02-13 | Discharge: 2022-02-13 | Discharge status: Against Medical Advice | Payer: Medicaid Other | Attending: Emergency Medicine | Admitting: Emergency Medicine

## 2022-02-13 DIAGNOSIS — N898 Other specified noninflammatory disorders of vagina: Secondary | ICD-10-CM | POA: Insufficient documentation

## 2022-02-13 DIAGNOSIS — Z5321 Procedure and treatment not carried out due to patient leaving prior to being seen by health care provider: Secondary | ICD-10-CM | POA: Insufficient documentation

## 2022-02-13 DIAGNOSIS — R300 Dysuria: Secondary | ICD-10-CM | POA: Insufficient documentation

## 2022-02-13 DIAGNOSIS — R3915 Urgency of urination: Secondary | ICD-10-CM | POA: Insufficient documentation

## 2022-02-13 LAB — URINALYSIS, ROUTINE W REFLEX MICROSCOPIC
Bilirubin Urine: NEGATIVE
Glucose, UA: NEGATIVE mg/dL
Hgb urine dipstick: NEGATIVE
Ketones, ur: NEGATIVE mg/dL
Nitrite: NEGATIVE
Protein, ur: NEGATIVE mg/dL
Specific Gravity, Urine: >=1.03 (ref 1.005–1.030)
pH: 5.5 (ref 5.0–8.0)

## 2022-02-13 LAB — PREGNANCY, URINE: Preg Test, Ur: NEGATIVE

## 2022-02-13 LAB — URINALYSIS, MICROSCOPIC (REFLEX): RBC / HPF: NONE SEEN RBC/hpf (ref 0–5)

## 2022-02-14 LAB — URINE CULTURE: Culture: NO GROWTH

## 2022-02-17 ENCOUNTER — Other Ambulatory Visit: Payer: Self-pay

## 2022-02-17 ENCOUNTER — Encounter (HOSPITAL_BASED_OUTPATIENT_CLINIC_OR_DEPARTMENT_OTHER): Payer: Self-pay

## 2022-02-17 DIAGNOSIS — R300 Dysuria: Secondary | ICD-10-CM | POA: Insufficient documentation

## 2022-02-17 DIAGNOSIS — R350 Frequency of micturition: Secondary | ICD-10-CM | POA: Insufficient documentation

## 2022-02-17 DIAGNOSIS — N760 Acute vaginitis: Secondary | ICD-10-CM | POA: Insufficient documentation

## 2022-02-17 NOTE — ED Triage Notes (Signed)
Pt reports urinary frequency and burning on urination x 1 week.

## 2022-02-18 ENCOUNTER — Emergency Department (HOSPITAL_BASED_OUTPATIENT_CLINIC_OR_DEPARTMENT_OTHER)
Admission: EM | Admit: 2022-02-18 | Discharge: 2022-02-18 | Disposition: A | Discharge status: Home / Self Care | Payer: Medicaid Other | Attending: Emergency Medicine | Admitting: Emergency Medicine

## 2022-02-18 DIAGNOSIS — N760 Acute vaginitis: Secondary | ICD-10-CM

## 2022-02-18 LAB — PREGNANCY, URINE: Preg Test, Ur: NEGATIVE

## 2022-02-18 LAB — URINALYSIS, ROUTINE W REFLEX MICROSCOPIC
Bilirubin Urine: NEGATIVE
Glucose, UA: NEGATIVE mg/dL
Hgb urine dipstick: NEGATIVE
Ketones, ur: NEGATIVE mg/dL
Leukocytes,Ua: NEGATIVE
Nitrite: NEGATIVE
Protein, ur: NEGATIVE mg/dL
Specific Gravity, Urine: >=1.03 (ref 1.005–1.030)
pH: 5.5 (ref 5.0–8.0)

## 2022-02-18 LAB — WET PREP, GENITAL
Clue Cells Wet Prep HPF POC: NONE SEEN
Sperm: NONE SEEN
Trich, Wet Prep: NONE SEEN
WBC, Wet Prep HPF POC: <10 (ref <10)
Yeast Wet Prep HPF POC: NONE SEEN

## 2022-02-18 MED ORDER — FLUCONAZOLE 150 MG PO TABS
150.0000 mg | ORAL_TABLET | Freq: Once | ORAL | Status: AC
  Administered 2022-02-18: 150 mg via ORAL
  Filled 2022-02-18: qty 1

## 2022-02-18 NOTE — ED Provider Notes (Signed)
[facility] Provider Note: Lowella Dell, MD, FACEP  CSN: [id number] MRN: [id number] ARRIVAL: 02/17/22 at 2327 ROOM: MH01/MH01   CHIEF COMPLAINT  Dysuria   HISTORY OF PRESENT ILLNESS  02/18/22 1:48 AM Courtney Ray is a 26 y.o. female with 1 week of urinary frequency and burning with urination.  The discomfort is present in the vulvovaginal region and she rates it as a 5 out of 10 when urinating.  She has also had a gray vaginal discharge and thinks, alternatively, she could have a recurrence of bacterial vaginosis.  She is not having vaginal bleeding or abdominal pain.   History reviewed. No pertinent past medical history.  History reviewed. No pertinent surgical history.  Family History  Problem Relation Age of Onset   Diabetes Brother    Stroke Other    Hypertension Other     Social History   Tobacco Use   Smoking status: Never   Smokeless tobacco: Never  Vaping Use   Vaping Use: Never used  Substance Use Topics   Alcohol use: Yes    Comment: occ   Drug use: No    Prior to Admission medications   Not on File    Allergies Patient has no known allergies.   REVIEW OF SYSTEMS  Negative except as noted here or in the History of Present Illness.   PHYSICAL EXAMINATION  Initial Vital Signs Blood pressure 117/83, pulse 88, temperature 98.7 F (37.1 C), temperature source Oral, resp. rate 15, height 5\' 6"  (1.676 m), weight 79.4 kg, last menstrual period 01/24/2022, SpO2 100 %.  Examination General: Well-developed, well-nourished female in no acute distress; appearance consistent with age of record HENT: normocephalic; atraumatic Eyes: pupils equal, round and reactive to light; extraocular muscles intact Neck: supple Heart: regular rate and rhythm Lungs: clear to auscultation bilaterally Abdomen: soft; nondistended; nontender; bowel sounds present GU: Mild vulvovaginal inflammation; white vaginal discharge; no vaginal bleeding; no cervical  motion tenderness; no adnexal Extremities: No deformity; full range of motion; pulses normal Neurologic: Awake, alert and oriented; motor function intact in all extremities and symmetric; no facial droop Skin: Warm and dry Psychiatric: Normal mood and affect   RESULTS  Summary of this visit's results, reviewed and interpreted by myself:   EKG Interpretation  Date/Time:    Ventricular Rate:    PR Interval:    QRS Duration:   QT Interval:    QTC Calculation:   R Axis:     Text Interpretation:         Laboratory Studies: Results for orders placed or performed during the hospital encounter of 02/18/22 (from the past 24 hour(s))  Urinalysis, Routine w reflex microscopic Urine, Clean Catch     Status: Abnormal   Collection Time: 02/18/22  1:28 AM  Result Value Ref Range   Color, Urine YELLOW YELLOW   APPearance CLOUDY (A) CLEAR   Specific Gravity, Urine >=1.030 1.005 - 1.030   pH 5.5 5.0 - 8.0   Glucose, UA NEGATIVE NEGATIVE mg/dL   Hgb urine dipstick NEGATIVE NEGATIVE   Bilirubin Urine NEGATIVE NEGATIVE   Ketones, ur NEGATIVE NEGATIVE mg/dL   Protein, ur NEGATIVE NEGATIVE mg/dL   Nitrite NEGATIVE NEGATIVE   Leukocytes,Ua NEGATIVE NEGATIVE  Pregnancy, urine     Status: None   Collection Time: 02/18/22  1:28 AM  Result Value Ref Range   Preg Test, Ur NEGATIVE NEGATIVE  Wet prep, genital     Status: None   Collection Time: 02/18/22  2:05 AM  Specimen: PATH Cytology Cervicovaginal Ancillary Only  Result Value Ref Range   Yeast Wet Prep HPF POC NONE SEEN NONE SEEN   Trich, Wet Prep NONE SEEN NONE SEEN   Clue Cells Wet Prep HPF POC NONE SEEN NONE SEEN   WBC, Wet Prep HPF POC <10 <10   Sperm NONE SEEN    Imaging Studies: No results found.  ED COURSE and MDM  Nursing notes, initial and subsequent vitals signs, including pulse oximetry, reviewed and interpreted by myself.  Vitals:   02/17/22 2345 02/17/22 2348  BP:  117/83  Pulse:  88  Resp:  15  Temp:  98.7 F  (37.1 C)  TempSrc:  Oral  SpO2:  100%  Weight: 79.4 kg   Height: 5\' 6"  (1.676 m)    Medications  fluconazole (DIFLUCAN) tablet 150 mg (has no administration in time range)   2:20 AM No evidence of BV, trichomoniasis or candidiasis on wet prep.  We will treat for candidiasis as wet preps can frequently give a false negative.  Urine has been sent for culture.   PROCEDURES  Procedures   ED DIAGNOSES     ICD-10-CM   1. Vulvovaginitis  N76.0          Fabian Coca, MD 02/18/22 0222

## 2022-02-19 LAB — URINE CULTURE: Culture: <10000 — AB

## 2022-02-22 LAB — GC/CHLAMYDIA PROBE AMP (~~LOC~~) NOT AT ARMC
Comment: NEGATIVE
Comment: NORMAL

## 2022-10-19 ENCOUNTER — Emergency Department (HOSPITAL_BASED_OUTPATIENT_CLINIC_OR_DEPARTMENT_OTHER)
Admission: EM | Admit: 2022-10-19 | Discharge: 2022-10-19 | Disposition: A | Discharge status: Home / Self Care | Payer: 59 | Attending: Emergency Medicine | Admitting: Emergency Medicine

## 2022-10-19 ENCOUNTER — Other Ambulatory Visit: Payer: Self-pay

## 2022-10-19 DIAGNOSIS — J069 Acute upper respiratory infection, unspecified: Secondary | ICD-10-CM | POA: Insufficient documentation

## 2022-10-19 DIAGNOSIS — R300 Dysuria: Secondary | ICD-10-CM | POA: Diagnosis present

## 2022-10-19 DIAGNOSIS — Z1152 Encounter for screening for COVID-19: Secondary | ICD-10-CM | POA: Insufficient documentation

## 2022-10-19 DIAGNOSIS — R350 Frequency of micturition: Secondary | ICD-10-CM | POA: Diagnosis not present

## 2022-10-19 LAB — URINALYSIS, ROUTINE W REFLEX MICROSCOPIC
Bilirubin Urine: NEGATIVE
Glucose, UA: NEGATIVE mg/dL
Hgb urine dipstick: NEGATIVE
Ketones, ur: NEGATIVE mg/dL
Leukocytes,Ua: NEGATIVE
Nitrite: NEGATIVE
Protein, ur: NEGATIVE mg/dL
Specific Gravity, Urine: >=1.03 (ref 1.005–1.030)
pH: 6 (ref 5.0–8.0)

## 2022-10-19 LAB — RESP PANEL BY RT-PCR (RSV, FLU A&B, COVID)  RVPGX2
Influenza A by PCR: NEGATIVE
Influenza B by PCR: NEGATIVE
Resp Syncytial Virus by PCR: NEGATIVE
SARS Coronavirus 2 by RT PCR: NEGATIVE

## 2022-10-19 NOTE — ED Triage Notes (Signed)
Pt arrives with c/o cough, congestion, body aches, and chills that started Monday. Pt unsure of fever. Pt also endorse dysuria.

## 2022-10-19 NOTE — ED Provider Notes (Signed)
[facility] Provider Note   CSN: [id number] Arrival date & time: 10/19/22  1717     History  Chief Complaint  Patient presents with   Flu Like Symptoms   Dysuria    Lennetta Storlie is a 27 y.o. female, no pertinent past medical history, who presents to the ED secondary to cough, sore throat, chills, aches and pains for the last couple days.  She states that initially she had a cough, sore throat, which now has resolved, but now she has aches and chills.  Is concerned for COVID or flu and would like to be tested.  Also states that she has been having some increased urinary frequency and dysuria, and would like to be tested for UTI.  She denies any vaginal discharge, abdominal pain, does state that she may have back back pain, but is difficult to tell because she has bodyaches all over.  She denies any concerns for STDs. Home Medications Prior to Admission medications   Not on File      Allergies    Patient has no known allergies.    Review of Systems   Review of Systems  Constitutional:  Positive for chills. Negative for fever.  Genitourinary:  Positive for dysuria.    Physical Exam Updated Vital Signs BP (!) 154/91 (BP Location: Left Arm)   Pulse (!) 111   Temp 99.3 F (37.4 C) (Oral)   Resp 15   Wt 84.8 kg   LMP 10/01/2022   SpO2 100%   BMI 30.18 kg/m  Physical Exam Vitals and nursing note reviewed.  Constitutional:      General: She is not in acute distress.    Appearance: She is well-developed.  HENT:     Head: Normocephalic and atraumatic.     Nose: Nose normal.     Mouth/Throat:     Mouth: Mucous membranes are moist.  Eyes:     Conjunctiva/sclera: Conjunctivae normal.  Cardiovascular:     Rate and Rhythm: Normal rate and regular rhythm.     Heart sounds: No murmur heard. Pulmonary:     Effort: Pulmonary effort is normal. No respiratory distress.     Breath sounds: Normal breath sounds.  Abdominal:      Palpations: Abdomen is soft.     Tenderness: There is no abdominal tenderness.  Musculoskeletal:        General: No swelling.     Cervical back: Neck supple.  Skin:    General: Skin is warm and dry.     Capillary Refill: Capillary refill takes less than 2 seconds.  Neurological:     Mental Status: She is alert.  Psychiatric:        Mood and Affect: Mood normal.     ED Results / Procedures / Treatments   Labs (all labs ordered are listed, but only abnormal results are displayed) Labs Reviewed  RESP PANEL BY RT-PCR (RSV, FLU A&B, COVID)  [id number]  URINALYSIS, ROUTINE W REFLEX MICROSCOPIC    EKG None  Radiology No results found.  Procedures Procedures    Medications Ordered in ED Medications - No data to display  ED Course/ Medical Decision Making/ A&P                             Medical Decision Making Patient is a 27 year old female, here for testing of COVID/flu and UTI, we will obtain COVID and flu testing as well  as urinalysis.  Amount and/or Complexity of Data Reviewed Labs: ordered.    Details: Urinalysis unremarkable COVID/flu unremarkable Discussion of management or test interpretation with external provider(s): Discussed with patient, urinalysis unremarkable URI test unremarkable.  Discussed follow-up with primary care doctor, likely upper respiratory infection, discussed conservative treatment, and supportive care.  Also endorsed urinary frequency and importance of follow-up with PCP.  She declined STD testing today, but if she decides to further test, she can follow-up with health department.    Final Clinical Impression(s) / ED Diagnoses Final diagnoses:  Urinary frequency  Viral upper respiratory tract infection    Rx / DC Orders ED Discharge Orders     None         Osvaldo Shipper, PA 10/19/22 1851    Hayden Rasmussen, MD 10/20/22 [phone]

## 2022-10-19 NOTE — Discharge Instructions (Addendum)
Please follow-up with your primary care doctor, and return to the ER if you have severe abdominal pain, nausea, vomiting, or difficulty breathing. Please follow-up with the health department for STD testing as needed.

## 2022-10-19 NOTE — ED Notes (Signed)
Pt discharged to home. Discharge instructions have been discussed with patient and/or family members. Pt verbally acknowledges understanding d/c instructions, and endorses comprehension to checkout at registration before leaving.  °

## 2022-12-02 ENCOUNTER — Other Ambulatory Visit: Payer: Self-pay

## 2022-12-02 ENCOUNTER — Emergency Department (HOSPITAL_BASED_OUTPATIENT_CLINIC_OR_DEPARTMENT_OTHER): Payer: 59

## 2022-12-02 ENCOUNTER — Encounter (HOSPITAL_BASED_OUTPATIENT_CLINIC_OR_DEPARTMENT_OTHER): Payer: Self-pay | Admitting: *Deleted

## 2022-12-02 ENCOUNTER — Emergency Department (HOSPITAL_BASED_OUTPATIENT_CLINIC_OR_DEPARTMENT_OTHER)
Admission: EM | Admit: 2022-12-02 | Discharge: 2022-12-03 | Disposition: A | Discharge status: Home / Self Care | Payer: 59 | Attending: Emergency Medicine | Admitting: Emergency Medicine

## 2022-12-02 DIAGNOSIS — M25472 Effusion, left ankle: Secondary | ICD-10-CM | POA: Diagnosis not present

## 2022-12-02 NOTE — ED Triage Notes (Signed)
Left ankle pain and swelling since yesterday.  She states that she felt like she needed to pop her ankle but was not able to and pain and swelling have been increasing since.  Pt has visible swelling to left ankle

## 2022-12-03 DIAGNOSIS — M25472 Effusion, left ankle: Secondary | ICD-10-CM | POA: Diagnosis not present

## 2022-12-03 MED ORDER — NAPROXEN 250 MG PO TABS
500.0000 mg | ORAL_TABLET | Freq: Once | ORAL | Status: AC
  Administered 2022-12-03: 500 mg via ORAL
  Filled 2022-12-03: qty 2

## 2022-12-03 MED ORDER — NAPROXEN 375 MG PO TABS: ORAL_TABLET | ORAL | 0 refills | Status: DC

## 2022-12-03 NOTE — ED Provider Notes (Signed)
[facility] Provider Note: Lowella Dell, MD, FACEP  CSN: [id number] MRN: [id number] ARRIVAL: 12/02/22 at 2248 ROOM: MH12/MH12   CHIEF COMPLAINT  Ankle Pain   HISTORY OF PRESENT ILLNESS  12/03/22 12:15 AM Courtney Ray is a 27 y.o. female with 2 days of left ankle pain and swelling.  The onset was gradual.  She is not aware of any recent trauma to the ankle.  She rates her pain as an 8 out of 10.   She has a history of repeated trauma to that ankle when she was younger and played basketball.  She was told in the past that she had fluid in her ankle but she never had this formally addressed.   History reviewed. No pertinent past medical history.  History reviewed. No pertinent surgical history.  Family History  Problem Relation Age of Onset   Diabetes Brother    Stroke Other    Hypertension Other     Social History   Tobacco Use   Smoking status: Never   Smokeless tobacco: Never  Vaping Use   Vaping Use: Never used  Substance Use Topics   Alcohol use: Yes    Comment: occ   Drug use: No    Prior to Admission medications   Medication Sig Start Date End Date Taking? Authorizing Provider  naproxen (NAPROSYN) 375 MG tablet Take 1 tablet twice daily for ankle pain. 12/03/22  Yes Hamed Debella, MD    Allergies Patient has no known allergies.   REVIEW OF SYSTEMS  Negative except as noted here or in the History of Present Illness.   PHYSICAL EXAMINATION  Initial Vital Signs Blood pressure (!) 140/86, pulse 73, temperature (!) 97.5 F (36.4 C), temperature source Oral, resp. rate 16, last menstrual period 12/02/2022, SpO2 100 %.  Examination General: Well-developed, well-nourished female in no acute distress; appearance consistent with age of record HENT: normocephalic; atraumatic Eyes: Normal appearance Neck: supple Heart: regular rate and rhythm Lungs: clear to auscultation bilaterally Abdomen: soft; nondistended; nontender; bowel sounds  present Extremities: No deformity; pulses normal; swelling of left ankle without tenderness, erythema or warmth but with pain on passive movement, left foot distally neurovascularly intact Neurologic: Awake, alert and oriented; motor function intact in all extremities and symmetric; no facial droop Skin: Warm and dry Psychiatric: Normal mood and affect   RESULTS  Summary of this visit's results, reviewed and interpreted by myself:   EKG Interpretation  Date/Time:    Ventricular Rate:    PR Interval:    QRS Duration:   QT Interval:    QTC Calculation:   R Axis:     Text Interpretation:         Laboratory Studies: No results found for this or any previous visit (from the past 24 hour(s)). Imaging Studies: DG Ankle Complete Left  Result Date: 12/02/2022 CLINICAL DATA:  Left ankle pain and swelling since yesterday. EXAM: LEFT ANKLE COMPLETE - 3+ VIEW COMPARISON:  Ankle radiograph 01/06/2012 FINDINGS: No acute fracture. Corticated densities distal to the medial malleolus suggestive of remote prior injury. The ankle mortise is preserved. There is mild dorsal spurring at the capsular insertion of the talus. No erosion or periostitis. There is a small ankle joint effusion. Generalized soft tissue edema. IMPRESSION: 1. Generalized soft tissue edema and small ankle joint effusion. No acute osseous abnormality. 2. Corticated densities distal to the medial malleolus suggestive of remote prior injury. Electronically Signed   By: Narda Rutherford M.D.   On: 12/02/2022 23:50  ED COURSE and MDM  Nursing notes, initial and subsequent vitals signs, including pulse oximetry, reviewed and interpreted by myself.  Vitals:   12/02/22 2338  BP: (!) 140/86  Pulse: 73  Resp: 16  Temp: (!) 97.5 F (36.4 C)  TempSrc: Oral  SpO2: 100%   Medications  naproxen (NAPROSYN) tablet 500 mg (has no administration in time range)    We will place the patient in an ASO and provide crutches and treat with  naproxen.  We will refer to sports medicine for possible aspiration.  PROCEDURES  Procedures   ED DIAGNOSES     ICD-10-CM   1. Left ankle effusion  M25.472          Courtney Libra, MD 12/03/22 [phone]

## 2022-12-05 ENCOUNTER — Ambulatory Visit: Payer: 59 | Admitting: Family Medicine

## 2022-12-12 ENCOUNTER — Ambulatory Visit: Payer: 59 | Admitting: Family Medicine

## 2023-06-29 ENCOUNTER — Telehealth: Payer: 59 | Admitting: Physician Assistant

## 2023-06-29 DIAGNOSIS — J069 Acute upper respiratory infection, unspecified: Secondary | ICD-10-CM

## 2023-06-29 MED ORDER — LIDOCAINE VISCOUS HCL 2 % MT SOLN
OROMUCOSAL | 0 refills | Status: DC
Start: 2023-06-29 — End: 2023-10-30

## 2023-06-29 MED ORDER — FLUTICASONE PROPIONATE 50 MCG/ACT NA SUSP
2.0000 | Freq: Every day | NASAL | 0 refills | Status: DC
Start: 2023-06-29 — End: 2024-06-13

## 2023-06-29 MED ORDER — PSEUDOEPH-BROMPHEN-DM 30-2-10 MG/5ML PO SYRP: 5.0000 mL | ORAL_SOLUTION | Freq: Four times a day (QID) | ORAL | 0 refills | Status: DC | PRN

## 2023-06-29 NOTE — Patient Instructions (Addendum)
Trinna Post, thank you for joining Margaretann Loveless, PA-C for today's virtual visit.  While this provider is not your primary care provider (PCP), if your PCP is located in our provider database this encounter information will be shared with them immediately following your visit.   A [facility] MyChart account gives you access to today's visit and all your visits, tests, and labs performed at [facility] " click here if you don't have a [facility] MyChart account or go to [email or web address]  Consent: (Patient) Courtney Ray provided verbal consent for this virtual visit at the beginning of the encounter.  Current Medications:  Current Outpatient Medications:    brompheniramine-pseudoephedrine-DM 30-2-10 MG/5ML syrup, Take 5 mLs by mouth 4 (four) times daily as needed., Disp: 120 mL, Rfl: 0   fluticasone (FLONASE) 50 MCG/ACT nasal spray, Place 2 sprays into both nostrils daily., Disp: 16 g, Rfl: 0   lidocaine (XYLOCAINE) 2 % solution, Swallow 5mL every 4 hours as needed for sore throat, Disp: 100 mL, Rfl: 0   naproxen (NAPROSYN) 375 MG tablet, Take 1 tablet twice daily for ankle pain., Disp: 20 tablet, Rfl: 0   Medications ordered in this encounter:  Meds ordered this encounter  Medications   brompheniramine-pseudoephedrine-DM 30-2-10 MG/5ML syrup    Sig: Take 5 mLs by mouth 4 (four) times daily as needed.    Dispense:  120 mL    Refill:  0    Order Specific Question:   Supervising Provider    Answer:   Merrilee Jansky [[id number]]   fluticasone (FLONASE) 50 MCG/ACT nasal spray    Sig: Place 2 sprays into both nostrils daily.    Dispense:  16 g    Refill:  0    Order Specific Question:   Supervising Provider    Answer:   Merrilee Jansky [[id number]]   lidocaine (XYLOCAINE) 2 % solution    Sig: Swallow 5mL every 4 hours as needed for sore throat    Dispense:  100 mL    Refill:  0    Order Specific Question:   Supervising Provider    Answer:   Merrilee Jansky [id number]     *If you need refills on other medications prior to your next appointment, please contact your pharmacy*  Follow-Up: Call back or seek an in-person evaluation if the symptoms worsen or if the condition fails to improve as anticipated.   Virtual Care [phone]  Other Instructions  [employer] Covid + Flu test available over the counter  Viral Respiratory Infection A respiratory infection is an illness that affects part of the respiratory system, such as the lungs, nose, or throat. A respiratory infection that is caused by a virus is called a viral respiratory infection. Common types of viral respiratory infections include: A cold. The flu (influenza). A respiratory syncytial virus (RSV) infection. What are the causes? This condition is caused by a virus. The virus may spread through contact with droplets or direct contact with infected people or their mucus or secretions. The virus may spread from person to person (is contagious). What are the signs or symptoms? Symptoms of this condition include: A stuffy or runny nose. A sore throat or cough. Shortness of breath or difficulty breathing. Yellow or green mucus (sputum). Other symptoms may include: A fever. Sweating or chills. Fatigue. Achy muscles. A headache. How is this diagnosed? This condition may be diagnosed based on: Your symptoms. A physical exam. Testing of secretions from the nose or throat.  Chest X-ray. How is this treated? This condition may be treated with medicines, such as: Antiviral medicine. This may shorten the length of time a person has symptoms. Expectorants. These make it easier to cough up mucus. Decongestant nasal sprays. Acetaminophen or NSAIDs, such as ibuprofen, to relieve fever and pain. Antibiotic medicines are not prescribed for viral infections.This is because antibiotics are designed to kill bacteria. They do not kill viruses. Follow these instructions at  home: Managing pain and congestion Take over-the-counter and prescription medicines only as told by your health care provider. If you have a sore throat, gargle with a mixture of salt and water 3-4 times a day or as needed. To make salt water, completely dissolve -1 tsp (3-6 g) of salt in 1 cup (237 mL) of warm water. Use nose drops made from salt water to ease congestion and soften raw skin around your nose. Take 2 tsp (10 mL) of honey at bedtime to lessen coughing at night. Do not give honey to children who are younger than 1 year. Drink enough fluid to keep your urine pale yellow. This helps prevent dehydration and helps loosen up mucus. General instructions  Rest as much as possible. Do not drink alcohol. Do not use any products that contain nicotine or tobacco. These products include cigarettes, chewing tobacco, and vaping devices, such as e-cigarettes. If you need help quitting, ask your health care provider. Keep all follow-up visits. This is important. How is this prevented?     Get an annual flu shot. You may get the flu shot in late summer, fall, or winter. Ask your health care provider when you should get your flu shot. Avoid spreading your infection to other people. If you are sick: Wash your hands with soap and water often, especially after you cough or sneeze. Wash for at least 20 seconds. If soap and water are not available, use alcohol-based hand sanitizer. Cover your mouth when you cough. Cover your nose and mouth when you sneeze. Do not share cups or eating utensils. Clean commonly used objects often. Clean commonly touched surfaces. Stay home from work or school as told by your health care provider. Avoid contact with people who are sick during cold and flu season. This is generally fall and winter. Contact a health care provider if: Your symptoms last for 10 days or longer. Your symptoms get worse over time. You have severe sinus pain in your face or forehead. The  glands in your jaw or neck become very swollen. You have shortness of breath. Get help right away if you: Feel pain or pressure in your chest. Have trouble breathing. Faint or feel like you will faint. Have severe and persistent vomiting. Feel confused or disoriented. These symptoms may represent a serious problem that is an emergency. Do not wait to see if the symptoms will go away. Get medical help right away. Call your local emergency services (911 in the [place] Do not drive yourself to the hospital. Summary A respiratory infection is an illness that affects part of the respiratory system, such as the lungs, nose, or throat. A respiratory infection that is caused by a virus is called a viral respiratory infection. Common types of viral respiratory infections include a cold, influenza, and respiratory syncytial virus (RSV) infection. Symptoms of this condition include a stuffy or runny nose, cough, fatigue, achy muscles, sore throat, and fevers or chills. Antibiotic medicines are not prescribed for viral infections. This is because antibiotics are designed to kill bacteria. They are  not effective against viruses. This information is not intended to replace advice given to you by your health care provider. Make sure you discuss any questions you have with your health care provider. Document Revised: 11/11/2020 Document Reviewed: 11/11/2020 Elsevier Patient Education  2024 [employer]    If you have been instructed to have an in-person evaluation today at a local Urgent Care facility, please use the link below. It will take you to a list of all of our available [facility], including address, phone number and hours of operation. Please do not delay care.  [facility]  If you or a family member do not have a primary care provider, use the link below to schedule a visit and establish care. When you choose a [facility] primary care physician or advanced practice  provider, you gain a long-term partner in health. Find a Primary Care Provider  Learn more about [facility]'s in-office and virtual care options: [facility] - Get Care Now

## 2023-06-29 NOTE — Progress Notes (Signed)
Virtual Visit Consent   Courtney Ray, you are scheduled for a virtual visit with a [facility] provider today. Just as with appointments in the office, your consent must be obtained to participate. Your consent will be active for this visit and any virtual visit you may have with one of our providers in the next 365 days. If you have a MyChart account, a copy of this consent can be sent to you electronically.  As this is a virtual visit, video technology does not allow for your provider to perform a traditional examination. This may limit your provider's ability to fully assess your condition. If your provider identifies any concerns that need to be evaluated in person or the need to arrange testing (such as labs, EKG, etc.), we will make arrangements to do so. Although advances in technology are sophisticated, we cannot ensure that it will always work on either your end or our end. If the connection with a video visit is poor, the visit may have to be switched to a telephone visit. With either a video or telephone visit, we are not always able to ensure that we have a secure connection.  By engaging in this virtual visit, you consent to the provision of healthcare and authorize for your insurance to be billed (if applicable) for the services provided during this visit. Depending on your insurance coverage, you may receive a charge related to this service.  I need to obtain your verbal consent now. Are you willing to proceed with your visit today? Courtney Ray has provided verbal consent on 06/29/2023 for a virtual visit (video or telephone). Margaretann Loveless, PA-C  Date: 06/29/2023 2:51 PM  Virtual Visit via Video Note   I, Margaretann Loveless, connected with  Courtney Ray  ([id number], 1996/05/30) on 06/29/23 at  2:45 PM EST by a video-enabled telemedicine application and verified that I am speaking with the correct person using two identifiers.  Location: Patient: Virtual Visit Location  Patient: Home Provider: Virtual Visit Location Provider: Home Office   I discussed the limitations of evaluation and management by telemedicine and the availability of in person appointments. The patient expressed understanding and agreed to proceed.    History of Present Illness: Courtney Ray is a 27 y.o. who identifies as a female who was assigned female at birth, and is being seen today for URI symptoms.  HPI: Sore Throat  This is a new problem. The current episode started yesterday. The problem has been gradually worsening. Maximum temperature: subjective fevers. Associated symptoms include congestion, coughing, ear pain, headaches, swollen glands and trouble swallowing. Pertinent negatives include no ear discharge, hoarse voice or plugged ear sensation. Associated symptoms comments: Myalgias. She has tried acetaminophen and NSAIDs for the symptoms. The treatment provided no relief.     Problems: There are no problems to display for this patient.   Allergies: No Known Allergies Medications:  Current Outpatient Medications:    brompheniramine-pseudoephedrine-DM 30-2-10 MG/5ML syrup, Take 5 mLs by mouth 4 (four) times daily as needed., Disp: 120 mL, Rfl: 0   fluticasone (FLONASE) 50 MCG/ACT nasal spray, Place 2 sprays into both nostrils daily., Disp: 16 g, Rfl: 0   lidocaine (XYLOCAINE) 2 % solution, Swallow 5mL every 4 hours as needed for sore throat, Disp: 100 mL, Rfl: 0   naproxen (NAPROSYN) 375 MG tablet, Take 1 tablet twice daily for ankle pain., Disp: 20 tablet, Rfl: 0  Observations/Objective: Patient is well-developed, well-nourished in no acute distress.  Resting comfortably at home.  Head is normocephalic, atraumatic.  No labored breathing.  Speech is clear and coherent with logical content.  Patient is alert and oriented at baseline.    Assessment and Plan: 1. Viral URI with cough - brompheniramine-pseudoephedrine-DM 30-2-10 MG/5ML syrup; Take 5 mLs by mouth 4 (four)  times daily as needed.  Dispense: 120 mL; Refill: 0 - fluticasone (FLONASE) 50 MCG/ACT nasal spray; Place 2 sprays into both nostrils daily.  Dispense: 16 g; Refill: 0 - lidocaine (XYLOCAINE) 2 % solution; Swallow 5mL every 4 hours as needed for sore throat  Dispense: 100 mL; Refill: 0  - Suspect viral URI - Symptomatic medications of choice over the counter as needed - Bromfed DM for cough and congestion - Viscous lidocaine for sore throat - Fluticasone for nasal congestion, drainage and headache - Covid and flu test OTC, follow up with positive results - Push fluids - Rest - Seek further evaluation if symptoms change or worsen   Follow Up Instructions: I discussed the assessment and treatment plan with the patient. The patient was provided an opportunity to ask questions and all were answered. The patient agreed with the plan and demonstrated an understanding of the instructions.  A copy of instructions were sent to the patient via MyChart unless otherwise noted below.    The patient was advised to call back or seek an in-person evaluation if the symptoms worsen or if the condition fails to improve as anticipated.    Margaretann Loveless, PA-C

## 2023-08-01 ENCOUNTER — Telehealth: Payer: 59 | Admitting: Physician Assistant

## 2023-08-01 DIAGNOSIS — B379 Candidiasis, unspecified: Secondary | ICD-10-CM

## 2023-08-01 MED ORDER — FLUCONAZOLE 150 MG PO TABS
ORAL_TABLET | ORAL | 0 refills | Status: DC
Start: 2023-08-01 — End: 2024-06-13

## 2023-08-01 NOTE — Patient Instructions (Signed)
  Trinna Post, thank you for joining Gilberto Better, PA-C for today's virtual visit.  While this provider is not your primary care provider (PCP), if your PCP is located in our provider database this encounter information will be shared with them immediately following your visit.   A [facility] MyChart account gives you access to today's visit and all your visits, tests, and labs performed at [facility] " click here if you don't have a [facility] MyChart account or go to [email or web address]  Consent: (Patient) Courtney Ray provided verbal consent for this virtual visit at the beginning of the encounter.  Current Medications:  Current Outpatient Medications:    fluconazole (DIFLUCAN) 150 MG tablet, Take one tablet by mouth x 1 day. May repeat in 72 hours if symptoms don't improve., Disp: 2 tablet, Rfl: 0   brompheniramine-pseudoephedrine-DM 30-2-10 MG/5ML syrup, Take 5 mLs by mouth 4 (four) times daily as needed., Disp: 120 mL, Rfl: 0   fluticasone (FLONASE) 50 MCG/ACT nasal spray, Place 2 sprays into both nostrils daily., Disp: 16 g, Rfl: 0   lidocaine (XYLOCAINE) 2 % solution, Swallow 5mL every 4 hours as needed for sore throat, Disp: 100 mL, Rfl: 0   naproxen (NAPROSYN) 375 MG tablet, Take 1 tablet twice daily for ankle pain., Disp: 20 tablet, Rfl: 0   Medications ordered in this encounter:  Meds ordered this encounter  Medications   fluconazole (DIFLUCAN) 150 MG tablet    Sig: Take one tablet by mouth x 1 day. May repeat in 72 hours if symptoms don't improve.    Dispense:  2 tablet    Refill:  0    Order Specific Question:   Supervising Provider    Answer:   Merrilee Jansky [id number]     *If you need refills on other medications prior to your next appointment, please contact your pharmacy*  Follow-Up: Call back or seek an in-person evaluation if the symptoms worsen or if the condition fails to improve as anticipated.  [facility] [phone]  Other Instructions Start medicine as prescribed. Continue to watch for worsening symptoms. Schedule another appointment if symptoms don't improve.   If you have been instructed to have an in-person evaluation today at a local Urgent Care facility, please use the link below. It will take you to a list of all of our available [facility], including address, phone number and hours of operation. Please do not delay care.  [facility]  If you or a family member do not have a primary care provider, use the link below to schedule a visit and establish care. When you choose a [facility] primary care physician or advanced practice provider, you gain a long-term partner in health. Find a Primary Care Provider  Learn more about [facility]'s in-office and virtual care options: [facility] - Get Care Now

## 2023-08-01 NOTE — Progress Notes (Signed)
Virtual Visit Consent   Courtney Ray, you are scheduled for a virtual visit with a [facility] provider today. Just as with appointments in the office, your consent must be obtained to participate. Your consent will be active for this visit and any virtual visit you may have with one of our providers in the next 365 days. If you have a MyChart account, a copy of this consent can be sent to you electronically.  As this is a virtual visit, video technology does not allow for your provider to perform a traditional examination. This may limit your provider's ability to fully assess your condition. If your provider identifies any concerns that need to be evaluated in person or the need to arrange testing (such as labs, EKG, etc.), we will make arrangements to do so. Although advances in technology are sophisticated, we cannot ensure that it will always work on either your end or our end. If the connection with a video visit is poor, the visit may have to be switched to a telephone visit. With either a video or telephone visit, we are not always able to ensure that we have a secure connection.  By engaging in this virtual visit, you consent to the provision of healthcare and authorize for your insurance to be billed (if applicable) for the services provided during this visit. Depending on your insurance coverage, you may receive a charge related to this service.  I need to obtain your verbal consent now. Are you willing to proceed with your visit today? Hafeezah Bleak has provided verbal consent on 08/01/2023 for a virtual visit (video or telephone). Gilberto Better, [place]  Date: 08/01/2023 7:07 PM  Virtual Visit via Video Note   I, Cathi Hazan, connected with  Courtney Ray  ([id number], September 06, 1995) on 08/01/23 at  7:00 PM EST by a video-enabled telemedicine application and verified that I am speaking with the correct person using two identifiers.  Location: Patient: Virtual Visit Location Patient:  Home Provider: Virtual Visit Location Provider: Home Office   I discussed the limitations of evaluation and management by telemedicine and the availability of in person appointments. The patient expressed understanding and agreed to proceed.    History of Present Illness: Courtney Ray is a 27 y.o. who identifies as a female who was assigned female at birth, and is being seen today for vaginal discharge.  HPI: 27 y/o F presents via telehealth video visit for thick whitish discharge without odor for the past few days. She has tried otc vaginal creams ie Monistat without resolving her symptoms. Symptoms did improve, but hasn't completely resolved. Denies any other complaints at the current time.   Female GU Problem    Problems: There are no problems to display for this patient.   Allergies: No Known Allergies Medications:  Current Outpatient Medications:    fluconazole (DIFLUCAN) 150 MG tablet, Take one tablet by mouth x 1 day. May repeat in 72 hours if symptoms don't improve., Disp: 2 tablet, Rfl: 0   brompheniramine-pseudoephedrine-DM 30-2-10 MG/5ML syrup, Take 5 mLs by mouth 4 (four) times daily as needed., Disp: 120 mL, Rfl: 0   fluticasone (FLONASE) 50 MCG/ACT nasal spray, Place 2 sprays into both nostrils daily., Disp: 16 g, Rfl: 0   lidocaine (XYLOCAINE) 2 % solution, Swallow 5mL every 4 hours as needed for sore throat, Disp: 100 mL, Rfl: 0   naproxen (NAPROSYN) 375 MG tablet, Take 1 tablet twice daily for ankle pain., Disp: 20 tablet, Rfl: 0  Observations/Objective: Patient is well-developed,  well-nourished in no acute distress.  Resting comfortably  at home.  Head is normocephalic, atraumatic.  No labored breathing.  Speech is clear and coherent with logical content.  Patient is alert and oriented at baseline.    Assessment and Plan: 1. Yeast infection - fluconazole (DIFLUCAN) 150 MG tablet; Take one tablet by mouth x 1 day. May repeat in 72 hours if symptoms don't  improve.  Dispense: 2 tablet; Refill: 0  Start medicine as prescribed. Continue to watch for worsening symptoms. Schedule another appointment if symptoms don't improve. Pt verbalized understanding and in agreement.    Follow Up Instructions: I discussed the assessment and treatment plan with the patient. The patient was provided an opportunity to ask questions and all were answered. The patient agreed with the plan and demonstrated an understanding of the instructions.  A copy of instructions were sent to the patient via MyChart unless otherwise noted below.   Patient has requested to receive PHI (AVS, Work Notes, etc) pertaining to this video visit through e-mail as they are currently without active MyChart. They have voiced understand that email is not considered secure and their health information could be viewed by someone other than the patient.   The patient was advised to call back or seek an in-person evaluation if the symptoms worsen or if the condition fails to improve as anticipated.    Gilberto Better, PA-C

## 2023-08-16 ENCOUNTER — Telehealth: Payer: 59 | Admitting: Physician Assistant

## 2023-08-16 DIAGNOSIS — N760 Acute vaginitis: Secondary | ICD-10-CM | POA: Diagnosis not present

## 2023-08-16 MED ORDER — METRONIDAZOLE 500 MG PO TABS: 500.0000 mg | ORAL_TABLET | Freq: Two times a day (BID) | ORAL | 0 refills | Status: AC

## 2023-08-16 NOTE — Patient Instructions (Signed)
  Courtney Ray, thank you for joining [name], PA-C for today's virtual visit.  While this provider is not your primary care provider (PCP), if your PCP is located in our provider database this encounter information will be shared with them immediately following your visit.   A [facility] MyChart account gives you access to today's visit and all your visits, tests, and labs performed at [facility] " click here if you don't have a [facility] MyChart account or go to [email or web address]  Consent: (Patient) Courtney Ray provided verbal consent for this virtual visit at the beginning of the encounter.  Current Medications:  Current Outpatient Medications:    brompheniramine-pseudoephedrine-DM 30-2-10 MG/5ML syrup, Take 5 mLs by mouth 4 (four) times daily as needed., Disp: 120 mL, Rfl: 0   fluconazole (DIFLUCAN) 150 MG tablet, Take one tablet by mouth x 1 day. May repeat in 72 hours if symptoms don't improve., Disp: 2 tablet, Rfl: 0   fluticasone (FLONASE) 50 MCG/ACT nasal spray, Place 2 sprays into both nostrils daily., Disp: 16 g, Rfl: 0   lidocaine (XYLOCAINE) 2 % solution, Swallow 5mL every 4 hours as needed for sore throat, Disp: 100 mL, Rfl: 0   naproxen (NAPROSYN) 375 MG tablet, Take 1 tablet twice daily for ankle pain., Disp: 20 tablet, Rfl: 0   Medications ordered in this encounter:  No orders of the defined types were placed in this encounter.    *If you need refills on other medications prior to your next appointment, please contact your pharmacy*  Follow-Up: Call back or seek an in-person evaluation if the symptoms worsen or if the condition fails to improve as anticipated.  [facility] [phone]  Other Instructions Follow up with your primary care provider.    If you have been instructed to have an in-person evaluation today at a local Urgent Care facility, please use the link below. It will take you to a list of all of our  available [facility], including address, phone number and hours of operation. Please do not delay care.  [facility]  If you or a family member do not have a primary care provider, use the link below to schedule a visit and establish care. When you choose a [facility] primary care physician or advanced practice provider, you gain a long-term partner in health. Find a Primary Care Provider  Learn more about [facility]'s in-office and virtual care options: [facility] - Get Care Now

## 2023-08-16 NOTE — Progress Notes (Signed)
Virtual Visit Consent   Courtney Ray, you are scheduled for a virtual visit with a [facility] provider today. Just as with appointments in the office, your consent must be obtained to participate. Your consent will be active for this visit and any virtual visit you may have with one of our providers in the next 365 days. If you have a MyChart account, a copy of this consent can be sent to you electronically.  As this is a virtual visit, video technology does not allow for your provider to perform a traditional examination. This may limit your provider's ability to fully assess your condition. If your provider identifies any concerns that need to be evaluated in person or the need to arrange testing (such as labs, EKG, etc.), we will make arrangements to do so. Although advances in technology are sophisticated, we cannot ensure that it will always work on either your end or our end. If the connection with a video visit is poor, the visit may have to be switched to a telephone visit. With either a video or telephone visit, we are not always able to ensure that we have a secure connection.  By engaging in this virtual visit, you consent to the provision of healthcare and authorize for your insurance to be billed (if applicable) for the services provided during this visit. Depending on your insurance coverage, you may receive a charge related to this service.  I need to obtain your verbal consent now. Are you willing to proceed with your visit today? Tehila Costanzo has provided verbal consent on 08/16/2023 for a virtual visit (video or telephone). Courtney Ray, [place]  Date: 08/16/2023 5:23 PM  Virtual Visit via Video Note   I, Courtney Ray, connected with  Lorae Ibsen  ([id number], [birth year]) on 08/16/23 at  5:15 PM EST by a video-enabled telemedicine application and verified that I am speaking with the correct person using two identifiers.  Location: Patient: Virtual Visit Location Patient:  Home Provider: Virtual Visit Location Provider: Home Office   I discussed the limitations of evaluation and management by telemedicine and the availability of in person appointments. The patient expressed understanding and agreed to proceed.    History of Present Illness: Courtney Ray is a 27 y.o. who identifies as a female who was assigned female at birth, and is being seen today for BV symptoms x 4 days.  HPI: Vaginal Discharge The patient's primary symptoms include vaginal discharge. This is a recurrent problem. She is not pregnant. The vaginal discharge was grey. There has been no bleeding. She has not been passing clots. She has not been passing tissue. She is sexually active.    Problems: There are no active problems to display for this patient.   Allergies: No Known Allergies Medications:  Current Outpatient Medications:    brompheniramine-pseudoephedrine-DM 30-2-10 MG/5ML syrup, Take 5 mLs by mouth 4 (four) times daily as needed., Disp: 120 mL, Rfl: 0   fluconazole (DIFLUCAN) 150 MG tablet, Take one tablet by mouth x 1 day. May repeat in 72 hours if symptoms don't improve., Disp: 2 tablet, Rfl: 0   fluticasone (FLONASE) 50 MCG/ACT nasal spray, Place 2 sprays into both nostrils daily., Disp: 16 g, Rfl: 0   lidocaine (XYLOCAINE) 2 % solution, Swallow 5mL every 4 hours as needed for sore throat, Disp: 100 mL, Rfl: 0   naproxen (NAPROSYN) 375 MG tablet, Take 1 tablet twice daily for ankle pain., Disp: 20 tablet, Rfl: 0  Observations/Objective: Patient is well-developed, well-nourished in no  acute distress.  Resting comfortably at home.  Head is normocephalic, atraumatic.  No labored breathing.  Speech is clear and coherent with logical content.  Patient is alert and oriented at baseline.    Assessment and Plan: 1. Acute vaginitis (Primary)  Patient presenting with history of fishy grey discharge and history of similar episodes in the past. At this time, it is felt that the  most likely explanation for the patient's symptoms is bacterial vaginosis.  I also considered PID, pregnancy, ectopic pregnancy, endometriosis, tubovarian abscess, appendicitis, UTI and pyelonephritis, but this appears less likely considering the data gathered thus far.  I have instructed the patient to report to the ER at any time if there are any new or worsening symptoms.  The patient expressed understanding of and agreement with this plan.  Opportunity was given for questions prior to discharge and all stated questions were answered to the patient's satisfaction.    Follow Up Instructions: I discussed the assessment and treatment plan with the patient. The patient was provided an opportunity to ask questions and all were answered. The patient agreed with the plan and demonstrated an understanding of the instructions.  A copy of instructions were sent to the patient via MyChart unless otherwise noted below.    The patient was advised to call back or seek an in-person evaluation if the symptoms worsen or if the condition fails to improve as anticipated.    Courtney Kidney, PA-C

## 2023-10-26 DIAGNOSIS — H5213 Myopia, bilateral: Secondary | ICD-10-CM | POA: Diagnosis not present

## 2023-10-29 ENCOUNTER — Other Ambulatory Visit: Payer: Self-pay

## 2023-10-29 DIAGNOSIS — Z5321 Procedure and treatment not carried out due to patient leaving prior to being seen by health care provider: Secondary | ICD-10-CM | POA: Insufficient documentation

## 2023-10-29 DIAGNOSIS — M791 Myalgia, unspecified site: Secondary | ICD-10-CM | POA: Insufficient documentation

## 2023-10-29 DIAGNOSIS — R5383 Other fatigue: Secondary | ICD-10-CM | POA: Diagnosis not present

## 2023-10-29 DIAGNOSIS — J029 Acute pharyngitis, unspecified: Secondary | ICD-10-CM | POA: Diagnosis not present

## 2023-10-29 LAB — URINALYSIS, ROUTINE W REFLEX MICROSCOPIC
Bilirubin Urine: NEGATIVE
Glucose, UA: NEGATIVE mg/dL
Hgb urine dipstick: NEGATIVE
Ketones, ur: NEGATIVE mg/dL
Leukocytes,Ua: NEGATIVE
Nitrite: NEGATIVE
Protein, ur: NEGATIVE mg/dL
Specific Gravity, Urine: >=1.03 (ref 1.005–1.030)
pH: 5.5 (ref 5.0–8.0)

## 2023-10-29 LAB — RESP PANEL BY RT-PCR (RSV, FLU A&B, COVID)  RVPGX2
Influenza A by PCR: NEGATIVE
Influenza B by PCR: NEGATIVE
Resp Syncytial Virus by PCR: NEGATIVE
SARS Coronavirus 2 by RT PCR: NEGATIVE

## 2023-10-29 NOTE — ED Triage Notes (Signed)
 Patient coming to ED for evaluation of flu-like symptoms.  States mother was recently dx with flu and pneumonia.  Having body aches, sore throat, and fatigue.  Reports "I like I also might have a UTI.  Those symptoms started about 3 days ago."  Unknown if patient has had fevers.

## 2023-10-30 ENCOUNTER — Telehealth: Admitting: Physician Assistant

## 2023-10-30 ENCOUNTER — Emergency Department (HOSPITAL_BASED_OUTPATIENT_CLINIC_OR_DEPARTMENT_OTHER)
Admission: EM | Admit: 2023-10-30 | Discharge: 2023-10-30 | Discharge status: Against Medical Advice | Attending: Emergency Medicine | Admitting: Emergency Medicine

## 2023-10-30 DIAGNOSIS — R3989 Other symptoms and signs involving the genitourinary system: Secondary | ICD-10-CM

## 2023-10-30 DIAGNOSIS — B9789 Other viral agents as the cause of diseases classified elsewhere: Secondary | ICD-10-CM | POA: Diagnosis not present

## 2023-10-30 DIAGNOSIS — J988 Other specified respiratory disorders: Secondary | ICD-10-CM

## 2023-10-30 MED ORDER — CEPHALEXIN 500 MG PO CAPS: 500.0000 mg | ORAL_CAPSULE | Freq: Two times a day (BID) | ORAL | 0 refills | Status: AC

## 2023-10-30 MED ORDER — LIDOCAINE VISCOUS HCL 2 % MT SOLN: OROMUCOSAL | 0 refills | Status: DC

## 2023-10-30 NOTE — Patient Instructions (Signed)
 Trinna Post, thank you for joining Margaretann Loveless, PA-C for today's virtual visit.  While this provider is not your primary care provider (PCP), if your PCP is located in our provider database this encounter information will be shared with them immediately following your visit.   A [facility] MyChart account gives you access to today's visit and all your visits, tests, and labs performed at [facility] " click here if you don't have a [facility] MyChart account or go to [email or web address]  Consent: (Patient) Courtney Ray provided verbal consent for this virtual visit at the beginning of the encounter.  Current Medications:  Current Outpatient Medications:    cephALEXin (KEFLEX) 500 MG capsule, Take 1 capsule (500 mg total) by mouth 2 (two) times daily for 10 days., Disp: 20 capsule, Rfl: 0   fluconazole (DIFLUCAN) 150 MG tablet, Take one tablet by mouth x 1 day. May repeat in 72 hours if symptoms don't improve., Disp: 2 tablet, Rfl: 0   fluticasone (FLONASE) 50 MCG/ACT nasal spray, Place 2 sprays into both nostrils daily., Disp: 16 g, Rfl: 0   lidocaine (XYLOCAINE) 2 % solution, Swallow 5mL every 4 hours as needed for sore throat, Disp: 100 mL, Rfl: 0   naproxen (NAPROSYN) 375 MG tablet, Take 1 tablet twice daily for ankle pain., Disp: 20 tablet, Rfl: 0   Medications ordered in this encounter:  Meds ordered this encounter  Medications   cephALEXin (KEFLEX) 500 MG capsule    Sig: Take 1 capsule (500 mg total) by mouth 2 (two) times daily for 10 days.    Dispense:  20 capsule    Refill:  0    Supervising Provider:   Merrilee Jansky [[id number]]   lidocaine (XYLOCAINE) 2 % solution    Sig: Swallow 5mL every 4 hours as needed for sore throat    Dispense:  100 mL    Refill:  0    Supervising Provider:   Merrilee Jansky [[id number]]     *If you need refills on other medications prior to your next appointment, please contact your pharmacy*  Follow-Up: Call  back or seek an in-person evaluation if the symptoms worsen or if the condition fails to improve as anticipated.  [facility] [phone]  Other Instructions  Urinary Tract Infection, Female A urinary tract infection (UTI) is an infection in your urinary tract. The urinary tract is made up of organs that make, store, and get rid of pee (urine) in your body. These organs include: The kidneys. The ureters. The bladder. The urethra. What are the causes? Most UTIs are caused by germs called bacteria. They may be in or near your genitals. These germs grow and cause swelling in your urinary tract. What increases the risk? You're more likely to get a UTI if: You're a female. The urethra is shorter in females than in males. You have a soft tube called a catheter that drains your pee. You can't control when you pee or poop. You have trouble peeing because of: A kidney stone. A urinary blockage. A nerve condition that affects your bladder. Not getting enough to drink. You're sexually active. You use a birth control inside your vagina, like spermicide. You're pregnant. You have low levels of the hormone estrogen in your body. You're an older adult. You're also more likely to get a UTI if you have other health problems. These may include: Diabetes. A weak immune system. Your immune system is your body's defense system. Sickle cell  disease. Injury of the spine. What are the signs or symptoms? Symptoms may include: Needing to pee right away. Peeing small amounts often. Pain or burning when you pee. Blood in your pee. Pee that smells bad or odd. Pain in your belly or lower back. You may also: Feel confused. This may be the first symptom in older adults. Vomit. Not feel hungry. Feel tired or easily annoyed. Have a fever or chills. How is this diagnosed? A UTI is diagnosed based on your medical history and an exam. You may also have other tests. These may include: Pee  tests. Blood tests. Tests for sexually transmitted infections (STIs). If you've had more than one UTI, you may need to have imaging studies done to find out why you keep getting them. How is this treated? A UTI can be treated by: Taking antibiotics or other medicines. Drinking enough fluid to keep your pee pale yellow. In rare cases, a UTI can cause a very bad condition called sepsis. Sepsis may be treated in the hospital. Follow these instructions at home: Medicines Take your medicines only as told by your health care provider. If you were given antibiotics, take them as told by your provider. Do not stop taking them even if you start to feel better. General instructions Make sure you: Pee often and fully. Do not hold your pee for a long time. Wipe from front to back after you pee or poop. Use each tissue only once when you wipe. Pee after you have sex. Do not douche or use sprays or powders in your genital area. Contact a health care provider if: Your symptoms don't get better after 1-2 days of taking antibiotics. Your symptoms go away and then come back. You have a fever or chills. You vomit or feel like you may vomit. Get help right away if: You have very bad pain in your back or lower belly. You faint. This information is not intended to replace advice given to you by your health care provider. Make sure you discuss any questions you have with your health care provider. Document Revised: 03/15/2023 Document Reviewed: 11/10/2022 Elsevier Patient Education  2024 [employer]  Viral Respiratory Infection A respiratory infection is an illness that affects part of the respiratory system, such as the lungs, nose, or throat. A respiratory infection that is caused by a virus is called a viral respiratory infection. Common types of viral respiratory infections include: A cold. The flu (influenza). A respiratory syncytial virus (RSV) infection. What are the causes? This condition  is caused by a virus. The virus may spread through contact with droplets or direct contact with infected people or their mucus or secretions. The virus may spread from person to person (is contagious). What are the signs or symptoms? Symptoms of this condition include: A stuffy or runny nose. A sore throat or cough. Shortness of breath or difficulty breathing. Yellow or green mucus (sputum). Other symptoms may include: A fever. Sweating or chills. Fatigue. Achy muscles. A headache. How is this diagnosed? This condition may be diagnosed based on: Your symptoms. A physical exam. Testing of secretions from the nose or throat. Chest X-ray. How is this treated? This condition may be treated with medicines, such as: Antiviral medicine. This may shorten the length of time a person has symptoms. Expectorants. These make it easier to cough up mucus. Decongestant nasal sprays. Acetaminophen or NSAIDs, such as ibuprofen, to relieve fever and pain. Antibiotic medicines are not prescribed for viral infections.This is because antibiotics  are designed to kill bacteria. They do not kill viruses. Follow these instructions at home: Managing pain and congestion Take over-the-counter and prescription medicines only as told by your health care provider. If you have a sore throat, gargle with a mixture of salt and water 3-4 times a day or as needed. To make salt water, completely dissolve -1 tsp (3-6 g) of salt in 1 cup (237 mL) of warm water. Use nose drops made from salt water to ease congestion and soften raw skin around your nose. Take 2 tsp (10 mL) of honey at bedtime to lessen coughing at night. Do not give honey to children who are younger than 1 year. Drink enough fluid to keep your urine pale yellow. This helps prevent dehydration and helps loosen up mucus. General instructions  Rest as much as possible. Do not drink alcohol. Do not use any products that contain nicotine or tobacco. These  products include cigarettes, chewing tobacco, and vaping devices, such as e-cigarettes. If you need help quitting, ask your health care provider. Keep all follow-up visits. This is important. How is this prevented?     Get an annual flu shot. You may get the flu shot in late summer, fall, or winter. Ask your health care provider when you should get your flu shot. Avoid spreading your infection to other people. If you are sick: Wash your hands with soap and water often, especially after you cough or sneeze. Wash for at least 20 seconds. If soap and water are not available, use alcohol-based hand sanitizer. Cover your mouth when you cough. Cover your nose and mouth when you sneeze. Do not share cups or eating utensils. Clean commonly used objects often. Clean commonly touched surfaces. Stay home from work or school as told by your health care provider. Avoid contact with people who are sick during cold and flu season. This is generally fall and winter. Contact a health care provider if: Your symptoms last for 10 days or longer. Your symptoms get worse over time. You have severe sinus pain in your face or forehead. The glands in your jaw or neck become very swollen. You have shortness of breath. Get help right away if you: Feel pain or pressure in your chest. Have trouble breathing. Faint or feel like you will faint. Have severe and persistent vomiting. Feel confused or disoriented. These symptoms may represent a serious problem that is an emergency. Do not wait to see if the symptoms will go away. Get medical help right away. Call your local emergency services (911 in the [place] Do not drive yourself to the hospital. Summary A respiratory infection is an illness that affects part of the respiratory system, such as the lungs, nose, or throat. A respiratory infection that is caused by a virus is called a viral respiratory infection. Common types of viral respiratory infections include a  cold, influenza, and respiratory syncytial virus (RSV) infection. Symptoms of this condition include a stuffy or runny nose, cough, fatigue, achy muscles, sore throat, and fevers or chills. Antibiotic medicines are not prescribed for viral infections. This is because antibiotics are designed to kill bacteria. They are not effective against viruses. This information is not intended to replace advice given to you by your health care provider. Make sure you discuss any questions you have with your health care provider. Document Revised: 11/11/2020 Document Reviewed: 11/11/2020 Elsevier Patient Education  2024 [employer].   If you have been instructed to have an in-person evaluation today at a local  Urgent Care facility, please use the link below. It will take you to a list of all of our available [facility], including address, phone number and hours of operation. Please do not delay care.  [facility]  If you or a family member do not have a primary care provider, use the link below to schedule a visit and establish care. When you choose a [facility] primary care physician or advanced practice provider, you gain a long-term partner in health. Find a Primary Care Provider  Learn more about [facility]'s in-office and virtual care options: [facility] Get Care Now

## 2023-10-30 NOTE — Progress Notes (Signed)
 Virtual Visit Consent   Courtney Ray, you are scheduled for a virtual visit with a [facility] provider today. Just as with appointments in the office, your consent must be obtained to participate. Your consent will be active for this visit and any virtual visit you may have with one of our providers in the next 365 days. If you have a MyChart account, a copy of this consent can be sent to you electronically.  As this is a virtual visit, video technology does not allow for your provider to perform a traditional examination. This may limit your provider's ability to fully assess your condition. If your provider identifies any concerns that need to be evaluated in person or the need to arrange testing (such as labs, EKG, etc.), we will make arrangements to do so. Although advances in technology are sophisticated, we cannot ensure that it will always work on either your end or our end. If the connection with a video visit is poor, the visit may have to be switched to a telephone visit. With either a video or telephone visit, we are not always able to ensure that we have a secure connection.  By engaging in this virtual visit, you consent to the provision of healthcare and authorize for your insurance to be billed (if applicable) for the services provided during this visit. Depending on your insurance coverage, you may receive a charge related to this service.  I need to obtain your verbal consent now. Are you willing to proceed with your visit today? Courtney Ray has provided verbal consent on 10/30/2023 for a virtual visit (video or telephone). Courtney Loveless, PA-C  Date: 10/30/2023 9:15 AM   Virtual Visit via Video Note   I, Courtney Ray, connected with  Courtney Ray  ([id number], 1996-01-01) on 10/30/23 at  8:45 AM EDT by a video-enabled telemedicine application and verified that I am speaking with the correct person using two identifiers.  Location: Patient: Virtual Visit  Location Patient: Home Provider: Virtual Visit Location Provider: Home Office   I discussed the limitations of evaluation and management by telemedicine and the availability of in person appointments. The patient expressed understanding and agreed to proceed.    History of Present Illness: Courtney Ray is a 28 y.o. who identifies as a female who was assigned female at birth, and is being seen today for URI and UTI symptoms.  HPI: URI  This is a new problem. The current episode started in the past 7 days (Sunday night). The problem has been gradually worsening. The maximum temperature recorded prior to her arrival was 100.4 - 100.9 F. The fever has been present for Less than 1 day. Associated symptoms include congestion, headaches, neck pain, rhinorrhea (and post nasal drainage), sinus pain, a sore throat and swollen glands. Pertinent negatives include no coughing, diarrhea, ear pain, nausea, plugged ear sensation, vomiting or wheezing. Associated symptoms comments: Myalgias, chills. She has tried acetaminophen (ibuprofen, dayquil, nyquil) for the symptoms. The treatment provided no relief.  Urinary Tract Infection  This is a new problem. The current episode started in the past 7 days (3-4 days ago). The problem occurs every urination. The quality of the pain is described as burning. The pain is mild. The maximum temperature recorded prior to her arrival was 100 - 100.9 F. The fever has been present for Less than 1 day. Associated symptoms include chills, frequency, hesitancy and urgency. Pertinent negatives include no discharge, flank pain, hematuria, nausea, possible pregnancy, sweats or vomiting. Associated symptoms  comments: Suprapubic pressure. She has tried increased fluids, acetaminophen and NSAIDs (AZO urinary pain relief, AZO UTI symptom) for the symptoms. The treatment provided no relief.   Tested positive for Leuks and Nitrites on UTI symptom checker Seen at ER yesterday and tested  negative for Flu and Covid. UA was unremarkable.  Problems: There are no active problems to display for this patient.   Allergies: No Known Allergies Medications:  Current Outpatient Medications:    cephALEXin (KEFLEX) 500 MG capsule, Take 1 capsule (500 mg total) by mouth 2 (two) times daily for 10 days., Disp: 20 capsule, Rfl: 0   fluconazole (DIFLUCAN) 150 MG tablet, Take one tablet by mouth x 1 day. May repeat in 72 hours if symptoms don't improve., Disp: 2 tablet, Rfl: 0   fluticasone (FLONASE) 50 MCG/ACT nasal spray, Place 2 sprays into both nostrils daily., Disp: 16 g, Rfl: 0   lidocaine (XYLOCAINE) 2 % solution, Swallow 5mL every 4 hours as needed for sore throat, Disp: 100 mL, Rfl: 0   naproxen (NAPROSYN) 375 MG tablet, Take 1 tablet twice daily for ankle pain., Disp: 20 tablet, Rfl: 0  Observations/Objective: Patient is well-developed, well-nourished in no acute distress.  Resting comfortably at home.  Head is normocephalic, atraumatic.  No labored breathing.  Speech is clear and coherent with logical content.  Patient is alert and oriented at baseline.    Assessment and Plan: 1. Suspected UTI (Primary) - cephALEXin (KEFLEX) 500 MG capsule; Take 1 capsule (500 mg total) by mouth 2 (two) times daily for 10 days.  Dispense: 20 capsule; Refill: 0  2. Viral respiratory illness - lidocaine (XYLOCAINE) 2 % solution; Swallow 5mL every 4 hours as needed for sore throat  Dispense: 100 mL; Refill: 0  - Worsening urinary symptoms.  - Will treat empirically with Keflex - May use AZO for bladder spasms - Continue to push fluids.  - Seek in person evaluation for urine culture if symptoms do not improve or if they worsen.   - Suspect viral URI - Symptomatic medications of choice over the counter as needed - Viscous lidocaine for sore throat - Push fluids - Rest - Steam and Humidifier can help - Seek further evaluation if symptoms change or worsen   Follow Up Instructions: I  discussed the assessment and treatment plan with the patient. The patient was provided an opportunity to ask questions and all were answered. The patient agreed with the plan and demonstrated an understanding of the instructions.  A copy of instructions were sent to the patient via MyChart unless otherwise noted below.    The patient was advised to call back or seek an in-person evaluation if the symptoms worsen or if the condition fails to improve as anticipated.    Courtney Loveless, PA-C

## 2023-11-08 DIAGNOSIS — E66811 Obesity, class 1: Secondary | ICD-10-CM | POA: Diagnosis not present

## 2023-11-08 DIAGNOSIS — K5900 Constipation, unspecified: Secondary | ICD-10-CM | POA: Diagnosis not present

## 2023-11-13 DIAGNOSIS — Z0100 Encounter for examination of eyes and vision without abnormal findings: Secondary | ICD-10-CM | POA: Diagnosis not present

## 2023-12-14 ENCOUNTER — Ambulatory Visit: Admitting: Nurse Practitioner

## 2024-02-20 ENCOUNTER — Telehealth

## 2024-02-20 DIAGNOSIS — R3989 Other symptoms and signs involving the genitourinary system: Secondary | ICD-10-CM

## 2024-02-21 MED ORDER — NITROFURANTOIN MONOHYD MACRO 100 MG PO CAPS: 100.0000 mg | ORAL_CAPSULE | Freq: Two times a day (BID) | ORAL | 0 refills | Status: DC

## 2024-02-21 NOTE — Progress Notes (Signed)

## 2024-03-07 ENCOUNTER — Ambulatory Visit: Admitting: Family Medicine

## 2024-03-07 DIAGNOSIS — Z0000 Encounter for general adult medical examination without abnormal findings: Secondary | ICD-10-CM

## 2024-04-07 ENCOUNTER — Encounter: Admitting: Obstetrics and Gynecology

## 2024-06-12 ENCOUNTER — Telehealth: Admitting: Physician Assistant

## 2024-06-12 DIAGNOSIS — N760 Acute vaginitis: Secondary | ICD-10-CM

## 2024-06-12 DIAGNOSIS — B9689 Other specified bacterial agents as the cause of diseases classified elsewhere: Secondary | ICD-10-CM

## 2024-06-13 MED ORDER — METRONIDAZOLE 500 MG PO TABS: 500.0000 mg | ORAL_TABLET | Freq: Two times a day (BID) | ORAL | 0 refills | Status: AC

## 2024-06-13 NOTE — Progress Notes (Signed)

## 2024-06-14 ENCOUNTER — Encounter

## 2024-07-08 ENCOUNTER — Encounter: Payer: Self-pay | Admitting: Family Medicine

## 2024-07-08 ENCOUNTER — Telehealth: Admitting: Family Medicine

## 2024-07-08 DIAGNOSIS — J069 Acute upper respiratory infection, unspecified: Secondary | ICD-10-CM

## 2024-07-08 MED ORDER — IPRATROPIUM BROMIDE 0.03 % NA SOLN: 2.0000 | Freq: Two times a day (BID) | NASAL | 0 refills | Status: AC

## 2024-07-08 MED ORDER — BENZONATATE 100 MG PO CAPS: 100.0000 mg | ORAL_CAPSULE | Freq: Three times a day (TID) | ORAL | 0 refills | Status: AC | PRN

## 2024-07-08 NOTE — Progress Notes (Signed)

## 2024-08-04 ENCOUNTER — Telehealth: Admitting: Physician Assistant

## 2024-08-04 DIAGNOSIS — N760 Acute vaginitis: Secondary | ICD-10-CM | POA: Diagnosis not present

## 2024-08-04 DIAGNOSIS — B9689 Other specified bacterial agents as the cause of diseases classified elsewhere: Secondary | ICD-10-CM

## 2024-08-04 MED ORDER — METRONIDAZOLE 500 MG PO TABS: 500.0000 mg | ORAL_TABLET | Freq: Two times a day (BID) | ORAL | 0 refills | Status: DC

## 2024-08-04 NOTE — Progress Notes (Signed)

## 2024-08-05 MED ORDER — METRONIDAZOLE 500 MG PO TABS: 500.0000 mg | ORAL_TABLET | Freq: Two times a day (BID) | ORAL | 0 refills | Status: AC

## 2024-08-05 NOTE — Addendum Note (Signed)
 Addended byBETHA ROLAN BERTHOLD on: 08/05/2024 05:27 PM   Modules accepted: Orders
